# Patient Record
Sex: Female | Born: 1995 | Race: White | Hispanic: No | Marital: Single | Smoking: Never smoker
Health system: Southern US, Community
[De-identification: ages and names within clinical notes are randomized; demographics above are authoritative.]

## PROBLEM LIST (undated history)

## (undated) ENCOUNTER — Inpatient Hospital Stay (HOSPITAL_COMMUNITY): Payer: Self-pay

## (undated) DIAGNOSIS — Z6841 Body Mass Index (BMI) 40.0 and over, adult: Secondary | ICD-10-CM

## (undated) DIAGNOSIS — O139 Gestational [pregnancy-induced] hypertension without significant proteinuria, unspecified trimester: Secondary | ICD-10-CM

## (undated) DIAGNOSIS — N390 Urinary tract infection, site not specified: Secondary | ICD-10-CM

## (undated) HISTORY — DX: Gestational (pregnancy-induced) hypertension without significant proteinuria, unspecified trimester: O13.9

## (undated) HISTORY — PX: WISDOM TOOTH EXTRACTION: SHX21

## (undated) HISTORY — PX: TONSILLECTOMY: SUR1361

## (undated) HISTORY — PX: HERNIA REPAIR: SHX51

---
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
## 2017-04-16 DIAGNOSIS — N6459 Other signs and symptoms in breast: Secondary | ICD-10-CM | POA: Insufficient documentation

## 2017-12-29 LAB — OB RESULTS CONSOLE GC/CHLAMYDIA: GC PROBE AMP, GENITAL: NEGATIVE

## 2018-08-05 ENCOUNTER — Inpatient Hospital Stay (HOSPITAL_COMMUNITY)
Admission: AD | Admit: 2018-08-05 | Discharge: 2018-08-05 | Disposition: A | Discharge status: Home / Self Care | Payer: BC Managed Care – PPO | Source: Ambulatory Visit | Attending: Obstetrics and Gynecology | Admitting: Obstetrics and Gynecology

## 2018-08-05 ENCOUNTER — Other Ambulatory Visit: Payer: Self-pay

## 2018-08-05 ENCOUNTER — Encounter (HOSPITAL_COMMUNITY): Payer: Self-pay | Admitting: *Deleted

## 2018-08-05 ENCOUNTER — Inpatient Hospital Stay (HOSPITAL_BASED_OUTPATIENT_CLINIC_OR_DEPARTMENT_OTHER): Payer: BC Managed Care – PPO

## 2018-08-05 DIAGNOSIS — O9989 Other specified diseases and conditions complicating pregnancy, childbirth and the puerperium: Secondary | ICD-10-CM

## 2018-08-05 DIAGNOSIS — O99212 Obesity complicating pregnancy, second trimester: Secondary | ICD-10-CM

## 2018-08-05 DIAGNOSIS — E669 Obesity, unspecified: Secondary | ICD-10-CM | POA: Diagnosis not present

## 2018-08-05 DIAGNOSIS — N949 Unspecified condition associated with female genital organs and menstrual cycle: Secondary | ICD-10-CM | POA: Diagnosis not present

## 2018-08-05 DIAGNOSIS — O3680X0 Pregnancy with inconclusive fetal viability, not applicable or unspecified: Secondary | ICD-10-CM | POA: Diagnosis not present

## 2018-08-05 DIAGNOSIS — Z3A15 15 weeks gestation of pregnancy: Secondary | ICD-10-CM

## 2018-08-05 DIAGNOSIS — R102 Pelvic and perineal pain: Secondary | ICD-10-CM | POA: Diagnosis not present

## 2018-08-05 DIAGNOSIS — O26892 Other specified pregnancy related conditions, second trimester: Secondary | ICD-10-CM | POA: Insufficient documentation

## 2018-08-05 DIAGNOSIS — R1030 Lower abdominal pain, unspecified: Secondary | ICD-10-CM | POA: Diagnosis present

## 2018-08-05 LAB — CBC
HCT: 39.6 % (ref 36.0–46.0)
Hemoglobin: 13.6 g/dL (ref 12.0–15.0)
MCH: 30.3 pg (ref 26.0–34.0)
MCHC: 34.3 g/dL (ref 30.0–36.0)
MCV: 88.2 fL (ref 78.0–100.0)
PLATELETS: 251 10*3/uL (ref 150–400)
RBC: 4.49 MIL/uL (ref 3.87–5.11)
RDW: 12.6 % (ref 11.5–15.5)
WBC: 10.2 10*3/uL (ref 4.0–10.5)

## 2018-08-05 LAB — POCT PREGNANCY, URINE: Preg Test, Ur: POSITIVE — AB

## 2018-08-05 LAB — WET PREP, GENITAL
Clue Cells Wet Prep HPF POC: NONE SEEN
SPERM: NONE SEEN
Trich, Wet Prep: NONE SEEN
Yeast Wet Prep HPF POC: NONE SEEN

## 2018-08-05 LAB — URINALYSIS, ROUTINE W REFLEX MICROSCOPIC
Bilirubin Urine: NEGATIVE
GLUCOSE, UA: NEGATIVE mg/dL
HGB URINE DIPSTICK: NEGATIVE
Ketones, ur: 20 mg/dL — AB
NITRITE: NEGATIVE
PH: 6 (ref 5.0–8.0)
Protein, ur: NEGATIVE mg/dL
Specific Gravity, Urine: 1.023 (ref 1.005–1.030)

## 2018-08-05 NOTE — MAU Provider Note (Addendum)
History     CSN: [id number]  Arrival date and time: 08/05/18 1752   First Provider Initiated Contact with Patient 08/05/18 1911      Chief Complaint  Patient presents with  . Abdominal Pain  . Vaginal Discharge   G1 @15 .5 wks by sure LMP here with low abdominal cramping and vaginal discharge. Sx started about 1 week ago. LAP is bilateral and intermittent. Describes as mild menstrual cramping. Has not taken anything for it. Having some constipation, used Miralax and had small BM today. Vaginal discharge is white/yellow and thin, no odor or itching. No VB. No urinary sx. She reports [place] that was normal.   OB History    Gravida  1   Para      Term      Preterm      AB      Living        SAB      TAB      Ectopic      Multiple      Live Births              No past medical history on file.  Past Surgical History:  Procedure Laterality Date  . HERNIA REPAIR    . TONSILLECTOMY    . WISDOM TOOTH EXTRACTION      No family history on file.  Social History   Tobacco Use  . Smoking status: Never Smoker  Substance Use Topics  . Alcohol use: No  . Drug use: Not on file    Allergies: No Known Allergies  Medications Prior to Admission  Medication Sig Dispense Refill Last Dose  . TRI-PREVIFEM 0.18/0.215/0.25 MG-35 MCG tablet Take 1 tablet by mouth daily.  11 Past Week at Unknown time  . acetaminophen (TYLENOL) 325 MG tablet Take 325 mg by mouth every 6 (six) hours as needed for headache.   01/21/2015 at Unknown time  . HYDROcodone-acetaminophen (NORCO/VICODIN) 5-325 MG per tablet Take 1 tablet by mouth every 4 (four) hours as needed for pain. (Patient not taking: Reported on 01/21/2015) 10 tablet 0 Completed Course at Unknown time  . ibuprofen (ADVIL,MOTRIN) 800 MG tablet Take 1 tablet (800 mg total) by mouth 3 (three) times daily. (Patient not taking: Reported on 01/21/2015) 21 tablet 0 Completed Course at Unknown time    Review of  Systems  Constitutional: Negative for fever.  Gastrointestinal: Positive for abdominal pain and constipation. Negative for diarrhea, nausea and vomiting.  Genitourinary: Positive for vaginal discharge. Negative for dysuria, hematuria, urgency and vaginal bleeding.   Physical Exam   Blood pressure (!) 112/52, pulse (!) 128, temperature 98.4 F (36.9 C), temperature source Oral, resp. rate 14, height 5\' 9"  (1.753 m), weight (!) 176.7 kg, last menstrual period 04/17/2018, SpO2 95 %.  Physical Exam  Nursing note and vitals reviewed. Constitutional: She is oriented to person, place, and time. She appears well-developed and well-nourished. No distress.  HENT:  Head: Normocephalic and atraumatic.  Neck: Normal range of motion.  Respiratory: Effort normal. No respiratory distress.  GI: Soft. She exhibits no distension and no mass. There is no tenderness. There is no rebound and no guarding.  Large panus  Genitourinary:  Genitourinary Comments: External: no lesions or erythema Vagina: rugated, pink, moist, small thin white frothy discharge, malodorous Uterus/Adnexae: difficult to assess d/t body habitus Cervix closed/long   Musculoskeletal: Normal range of motion.  Neurological: She is alert and oriented to person, place, and  time.  Skin: Skin is warm and dry.  Psychiatric: She has a normal mood and affect.   Results for orders placed or performed during the hospital encounter of 08/05/18 (from the past 24 hour(s))  Urinalysis, Routine w reflex microscopic     Status: Abnormal   Collection Time: 08/05/18  6:49 PM  Result Value Ref Range   Color, Urine YELLOW YELLOW   APPearance HAZY (A) CLEAR   Specific Gravity, Urine 1.023 1.005 - 1.030   pH 6.0 5.0 - 8.0   Glucose, UA NEGATIVE NEGATIVE mg/dL   Hgb urine dipstick NEGATIVE NEGATIVE   Bilirubin Urine NEGATIVE NEGATIVE   Ketones, ur 20 (A) NEGATIVE mg/dL   Protein, ur NEGATIVE NEGATIVE mg/dL   Nitrite NEGATIVE NEGATIVE   Leukocytes,  UA SMALL (A) NEGATIVE   RBC / HPF 0-5 0 - 5 RBC/hpf   WBC, UA 6-10 0 - 5 WBC/hpf   Bacteria, UA FEW (A) NONE SEEN   Squamous Epithelial / LPF 6-10 0 - 5  Pregnancy, urine POC     Status: Abnormal   Collection Time: 08/05/18  6:54 PM  Result Value Ref Range   Preg Test, Ur POSITIVE (A) NEGATIVE  Wet prep, genital     Status: Abnormal   Collection Time: 08/05/18  7:23 PM  Result Value Ref Range   Yeast Wet Prep HPF POC NONE SEEN NONE SEEN   Trich, Wet Prep NONE SEEN NONE SEEN   Clue Cells Wet Prep HPF POC NONE SEEN NONE SEEN   WBC, Wet Prep HPF POC MANY (A) NONE SEEN   Sperm NONE SEEN   CBC     Status: None   Collection Time: 08/05/18  7:40 PM  Result Value Ref Range   WBC 10.2 4.0 - 10.5 K/uL   RBC 4.49 3.87 - 5.11 MIL/uL   Hemoglobin 13.6 12.0 - 15.0 g/dL   HCT 16.139.6 09.636.0 - 04.546.0 %   MCV 88.2 78.0 - 100.0 fL   MCH 30.3 26.0 - 34.0 pg   MCHC 34.3 30.0 - 36.0 g/dL   RDW 40.912.6 81.111.5 - 91.415.5 %   Platelets 251 150 - 400 K/uL    MAU Course  Procedures  MDM Labs and US ordered. Transfer of care given to Daylene Poseyeill, CNM Bhambri, Melanie, CNM  08/05/2018 8:02 PM   US shows IUP measuring 15w 5d with FHR 143 bpm, posterior placenta, normal fluid  Assessment and Plan   1. Round ligament pain   2. Pregnancy with inconclusive fetal viability   3. [redacted] weeks gestation of pregnancy   4. Pregnancy with inconclusive fetal viability, single or unspecified fetus   5. Obesity affecting pregnancy in second trimester    -Discharge home in stable condition -Encouraged patient to use pregnancy support belt and increase PO hydration -Preterm labor precautions discussed  -Patient advised to follow-up with OB of choice for prenatal care -Patient may return to MAU as needed or if her condition were to change or worsen  Rolm BookbinderCaroline M Meryem Haertel, [place] 08/05/18 9:37 PM

## 2018-08-05 NOTE — Discharge Instructions (Signed)
Round Ligament Pain °The round ligament is a cord of muscle and tissue that helps to support the uterus. It can become a source of pain during pregnancy if it becomes stretched or twisted as the baby grows. The pain usually begins in the second trimester of pregnancy, and it can come and go until the baby is delivered. It is not a serious problem, and it does not cause harm to the baby. °Round ligament pain is usually a short, sharp, and pinching pain, but it can also be a dull, lingering, and aching pain. The pain is felt in the lower side of the abdomen or in the groin. It usually starts deep in the groin and moves up to the outside of the hip area. Pain can occur with: °· A sudden change in position. °· Rolling over in bed. °· Coughing or sneezing. °· Physical activity. ° °Follow these instructions at home: °Watch your condition for any changes. Take these steps to help with your pain: °· When the pain starts, relax. Then try: °? Sitting down. °? Flexing your knees up to your abdomen. °? Lying on your side with one pillow under your abdomen and another pillow between your legs. °? Sitting in a warm bath for 15-20 minutes or until the pain goes away. °· Take over-the-counter and prescription medicines only as told by your health care provider. °· Move slowly when you sit and stand. °· Avoid long walks if they cause pain. °· Stop or lessen your physical activities if they cause pain. ° °Contact a health care provider if: °· Your pain does not go away with treatment. °· You feel pain in your back that you did not have before. °· Your medicine is not helping. °Get help right away if: °· You develop a fever or chills. °· You develop uterine contractions. °· You develop vaginal bleeding. °· You develop nausea or vomiting. °· You develop diarrhea. °· You have pain when you urinate. °This information is not intended to replace advice given to you by your health care provider. Make sure you discuss any questions you have  with your health care provider. °Document Released: 09/15/2008 Document Revised: 05/14/2016 Document Reviewed: 02/13/2015 °Elsevier Interactive Patient Education © 2018 Elsevier Inc. ° °Safe Medications in Pregnancy  ° °Acne: °Benzoyl Peroxide °Salicylic Acid ° °Backache/Headache: °Tylenol: 2 regular strength every 4 hours OR °             2 Extra strength every 6 hours ° °Colds/Coughs/Allergies: °Benadryl (alcohol free) 25 mg every 6 hours as needed °Breath right strips °Claritin °Cepacol throat lozenges °Chloraseptic throat spray °Cold-Eeze- up to three times per day °Cough drops, alcohol free °Flonase (by prescription only) °Guaifenesin °Mucinex °Robitussin DM (plain only, alcohol free) °Saline nasal spray/drops °Sudafed (pseudoephedrine) & Actifed ** use only after [redacted] weeks gestation and if you do not have high blood pressure °Tylenol °Vicks Vaporub °Zinc lozenges °Zyrtec  ° °Constipation: °Colace °Ducolax suppositories °Fleet enema °Glycerin suppositories °Metamucil °Milk of magnesia °Miralax °Senokot °Smooth move tea ° °Diarrhea: °Kaopectate °Imodium A-D ° °*NO pepto Bismol ° °Hemorrhoids: °Anusol °Anusol HC °Preparation H °Tucks ° °Indigestion: °Tums °Maalox °Mylanta °Zantac  °Pepcid ° °Insomnia: °Benadryl (alcohol free) 25mg every 6 hours as needed °Tylenol PM °Unisom, no Gelcaps ° °Leg Cramps: °Tums °MagGel ° °Nausea/Vomiting:  °Bonine °Dramamine °Emetrol °Ginger extract °Sea bands °Meclizine  °Nausea medication to take during pregnancy:  °Unisom (doxylamine succinate 25 mg tablets) Take one tablet daily at bedtime. If symptoms are not adequately controlled,   the dose can be increased to a maximum recommended dose of two tablets daily (1/2 tablet in the morning, 1/2 tablet mid-afternoon and one at bedtime). °Vitamin B6 100mg tablets. Take one tablet twice a day (up to 200 mg per day). ° °Skin Rashes: °Aveeno products °Benadryl cream or 25mg every 6 hours as needed °Calamine Lotion °1% cortisone  cream ° °Yeast infection: °Gyne-lotrimin 7 °Monistat 7 ° ° °**If taking multiple medications, please check labels to avoid duplicating the same active ingredients °**take medication as directed on the label °** Do not exceed 4000 mg of tylenol in 24 hours °**Do not take medications that contain aspirin or ibuprofen ° ° ° ° °

## 2018-08-05 NOTE — MAU Note (Signed)
Pt presents with c/o abdominal cramping @ umbilicus and vaginal discharge.  Denies VB or spotting.

## 2018-08-08 LAB — GC/CHLAMYDIA PROBE AMP (~~LOC~~) NOT AT ARMC
CHLAMYDIA, DNA PROBE: NEGATIVE
NEISSERIA GONORRHEA: NEGATIVE

## 2018-08-15 ENCOUNTER — Encounter: Payer: Self-pay | Admitting: Medical

## 2018-08-15 ENCOUNTER — Ambulatory Visit (INDEPENDENT_AMBULATORY_CARE_PROVIDER_SITE_OTHER): Payer: BC Managed Care – PPO | Admitting: Medical

## 2018-08-15 DIAGNOSIS — O0990 Supervision of high risk pregnancy, unspecified, unspecified trimester: Secondary | ICD-10-CM | POA: Insufficient documentation

## 2018-08-15 DIAGNOSIS — Z3490 Encounter for supervision of normal pregnancy, unspecified, unspecified trimester: Secondary | ICD-10-CM

## 2018-08-15 DIAGNOSIS — Z3402 Encounter for supervision of normal first pregnancy, second trimester: Secondary | ICD-10-CM | POA: Diagnosis not present

## 2018-08-15 NOTE — Progress Notes (Signed)
   PRENATAL VISIT NOTE  Subjective:  Kelly Lam is a 22 y.o. G1P0 at 10016w1d being seen today for first prenatal visit with us. She had [place] few visits with an OB in [place] prior to relocating here. She has some records scanned in Epic. She is currently monitored for the following issues for this high-risk pregnancy and has Encounter for supervision of normal pregnancy, unspecified, unspecified trimester and Morbid obesity (HCC) on their problem list.  Patient reports no complaints.  Contractions: Not present. Vag. Bleeding: None.   . Denies leaking of fluid.   The following portions of the patient's history were reviewed and updated as appropriate: allergies, current medications, past family history, past medical history, past social history, past surgical history and problem list. Problem list updated.  Objective:   Vitals:   08/15/18 0956  BP: 119/86  Pulse: (!) 109  Weight: (!) 392 lb 6.4 oz (178 kg)    Fetal Status:          Unable to obtain FHR with doppler due to maternal body habitus Bedside US performed - normal appears FHR noted. +FM noted   General:  Alert, oriented and cooperative. Patient is in no acute distress.  Skin: Skin is warm and dry. No rash noted.   Cardiovascular: Normal heart rate noted  Respiratory: Normal respiratory effort, no problems with respiration noted  Abdomen: Soft, gravid, appropriate for gestational age. No tenderness to palpation  Pain/Pressure: Present     Pelvic: Cervical exam deferred        Extremities: Normal range of motion.  Edema: None  Mental Status: Normal mood and affect. Normal behavior. Normal judgment and thought content.   Assessment and Plan:  Pregnancy: G1P0 at 7216w1d  1. Encounter for supervision of normal pregnancy, antepartum, unspecified gravidity - Discussed normal cadence of OB visits - Discussed reasons to present to MAU - Patient is taking PNV daily - [facility]  2. Morbid  obesity (HCC) - Patient advised to start 81 mg ASA daily - Discussed appropriate weight gain  - Discussed potential for regular growth scans throughout pregnancy   Preterm labor/ second trimester warning symptoms and general obstetric precautions including but not limited to vaginal bleeding, contractions, leaking of fluid and fetal movement were reviewed in detail with the patient. Please refer to After Visit Summary for other counseling recommendations.  Return in about 4 weeks (around 09/12/2018) for LOB.    Vonzella NippleJulie Wenzel, PA-C

## 2018-08-15 NOTE — Patient Instructions (Addendum)
Prenatal Care WHAT IS PRENATAL CARE? Prenatal care is the process of caring for a pregnant woman before she gives birth. Prenatal care makes sure that she and her baby remain as healthy as possible throughout pregnancy. Prenatal care may be provided by a midwife, family practice health care provider, or a childbirth and pregnancy specialist (obstetrician). Prenatal care may include physical examinations, testing, treatments, and education on nutrition, lifestyle, and social support services. WHY IS PRENATAL CARE SO IMPORTANT? Early and consistent prenatal care increases the chance that you and your baby will remain healthy throughout your pregnancy. This type of care also decreases a baby's risk of being born too early (prematurely), or being born smaller than expected (small for gestational age). Any underlying medical conditions you may have that could pose a risk during your pregnancy are discussed during prenatal care visits. You will also be monitored regularly for any new conditions that may arise during your pregnancy so they can be treated quickly and effectively. WHAT HAPPENS DURING PRENATAL CARE VISITS? Prenatal care visits may include the following: Discussion Tell your health care provider about any new signs or symptoms you have experienced since your last visit. These might include:  Nausea or vomiting.  Increased or decreased level of energy.  Difficulty sleeping.  Back or leg pain.  Weight changes.  Frequent urination.  Shortness of breath with physical activity.  Changes in your skin, such as the development of a rash or itchiness.  Vaginal discharge or bleeding.  Feelings of excitement or nervousness.  Changes in your baby's movements.  You may want to write down any questions or topics you want to discuss with your health care provider and bring them with you to your appointment. Examination During your first prenatal care visit, you will likely have a complete  physical exam. Your health care provider will often examine your vagina, cervix, and the position of your uterus, as well as check your heart, lungs, and other body systems. As your pregnancy progresses, your health care provider will measure the size of your uterus and your baby's position inside your uterus. He or she may also examine you for early signs of labor. Your prenatal visits may also include checking your blood pressure and, after about 10-12 weeks of pregnancy, listening to your baby's heartbeat. Testing Regular testing often includes:  Urinalysis. This checks your urine for glucose, protein, or signs of infection.  Blood count. This checks the levels of white and red blood cells in your body.  Tests for sexually transmitted infections (STIs). Testing for STIs at the beginning of pregnancy is routinely done and is required in many states.  Antibody testing. You will be checked to see if you are immune to certain illnesses, such as rubella, that can affect a developing fetus.  Glucose screen. Around 24-28 weeks of pregnancy, your blood glucose level will be checked for signs of gestational diabetes. Follow-up tests may be recommended.  Group B strep. This is a bacteria that is commonly found inside a woman's vagina. This test will inform your health care provider if you need an antibiotic to reduce the amount of this bacteria in your body prior to labor and childbirth.  Ultrasound. Many pregnant women undergo an ultrasound screening around 18-20 weeks of pregnancy to evaluate the health of the fetus and check for any developmental abnormalities.  HIV (human immunodeficiency virus) testing. Early in your pregnancy, you will be screened for HIV. If you are at high risk for HIV, this test may   be repeated during your third trimester of pregnancy.  You may be offered other testing based on your age, personal or family medical history, or other factors. HOW OFTEN SHOULD I PLAN TO SEE MY  HEALTH CARE PROVIDER FOR PRENATAL CARE? Your prenatal care check-up schedule depends on any medical conditions you have before, or develop during, your pregnancy. If you do not have any underlying medical conditions, you will likely be seen for checkups:  Monthly, during the first 6 months of pregnancy.  Twice a month during months 7 and 8 of pregnancy.  Weekly starting in the 9th month of pregnancy and until delivery.  If you develop signs of early labor or other concerning signs or symptoms, you may need to see your health care provider more often. Ask your health care provider what prenatal care schedule is best for you. WHAT CAN I DO TO KEEP MYSELF AND MY BABY AS HEALTHY AS POSSIBLE DURING MY PREGNANCY?  Take a prenatal vitamin containing 400 micrograms (0.4 mg) of folic acid every day. Your health care provider may also ask you to take additional vitamins such as iodine, vitamin D, iron, copper, and zinc.  Take 1500-2000 mg of calcium daily starting at your 20th week of pregnancy until you deliver your baby.  Make sure you are up to date on your vaccinations. Unless directed otherwise by your health care provider: ? You should receive a tetanus, diphtheria, and pertussis (Tdap) vaccination between the 27th and 36th week of your pregnancy, regardless of when your last Tdap immunization occurred. This helps protect your baby from whooping cough (pertussis) after he or she is born. ? You should receive an annual inactivated influenza vaccine (IIV) to help protect you and your baby from influenza. This can be done at any point during your pregnancy.  Eat a well-rounded diet that includes: ? Fresh fruits and vegetables. ? Lean proteins. ? Calcium-rich foods such as milk, yogurt, hard cheeses, and dark, leafy greens. ? Whole grain breads.  Do noteat seafood high in mercury, including: ? Swordfish. ? Tilefish. ? Shark. ? King mackerel. ? More than 6 oz tuna per week.  Do not  eat: ? Raw or undercooked meats or eggs. ? Unpasteurized foods, such as soft cheeses (brie, blue, or feta), juices, and milks. ? Lunch meats. ? Hot dogs that have not been heated until they are steaming.  Drink enough water to keep your urine clear or pale yellow. For many women, this may be 10 or more 8 oz glasses of water each day. Keeping yourself hydrated helps deliver nutrients to your baby and may prevent the start of pre-term uterine contractions.  Do not use any tobacco products including cigarettes, chewing tobacco, or electronic cigarettes. If you need help quitting, ask your health care provider.  Do not drink beverages containing alcohol. No safe level of alcohol consumption during pregnancy has been determined.  Do not use any illegal drugs. These can harm your developing baby or cause a miscarriage.  Ask your health care provider or pharmacist before taking any prescription or over-the-counter medicines, herbs, or supplements.  Limit your caffeine intake to no more than 200 mg per day.  Exercise. Unless told otherwise by your health care provider, try to get 30 minutes of moderate exercise most days of the week. Do not  do high-impact activities, contact sports, or activities with a high risk of falling, such as horseback riding or downhill skiing.  Get plenty of rest.  Avoid anything that raises your  body temperature, such as hot tubs and saunas.  If you own a cat, do not empty its litter box. Bacteria contained in cat feces can cause an infection called toxoplasmosis. This can result in serious harm to the fetus.  Stay away from chemicals such as insecticides, lead, mercury, and cleaning or paint products that contain solvents.  Do not have any X-rays taken unless medically necessary.  Take a childbirth and breastfeeding preparation class. Ask your health care provider if you need a referral or recommendation.  This information is not intended to replace advice given  to you by your health care provider. Make sure you discuss any questions you have with your health care provider. Document Released: 12/10/2003 Document Revised: 05/11/2016 Document Reviewed: 02/21/2014 Elsevier Interactive Patient Education  2017 [place] Education Options: [facility] Department Classes:  Childbirth education classes can help you get ready for a positive parenting experience. You can also meet other expectant parents and get free stuff for your baby. Each class runs for five weeks on the same night and costs $45 for the mother-to-be and her support person. Medicaid covers the cost if you are eligible. Call [phone] to register. [facility] Childbirth Education:  [phone] or [phone] or [email or web address]  Baby & Me Class: Discuss newborn & infant parenting and family adjustment issues with other new mothers in a relaxed environment. Each week brings a new speaker or baby-centered activity. We encourage new mothers to join [place] every Thursday at 11:00am. Babies birth until crawling. No registration or fee. Daddy [employer]: This course offers Dads-to-be the tools and knowledge needed to feel confident on their journey to becoming new fathers. Experienced dads, who have been trained as coaches, teach dads-to-be how to hold, comfort, diaper, swaddle and play with their infant while being able to support the new mom as well. A class for men taught by men. $25/dad Big Brother/Big Sister: Let your children share in the joy of a new brother or sister in this special class designed just for them. Class includes discussion about how families care for babies: swaddling, holding, diapering, safety as well as how they can be helpful in their new role. This class is designed for children ages 43 to 60, but any age is welcome. Please register each child individually. $5/child  Mom Talk: This mom-led group offers support and connection to mothers as  they journey through the adjustments and struggles of that sometimes overwhelming first year after the birth of a child. Tuesdays at 10:00am and Thursdays at 6:00pm. Babies welcome. No registration or fee. Breastfeeding Support Group: This group is a mother-to-mother support circle where moms have the opportunity to share their breastfeeding experiences. A Lactation Consultant is present for questions and concerns. Meets each Tuesday at 11:00am. No fee or registration. Breastfeeding Your Baby: Learn what to expect in the first days of breastfeeding your newborn.  This class will help you feel more confident with the skills needed to begin your breastfeeding experience. Many new mothers are concerned about breastfeeding after leaving the hospital. This class will also address the most common fears and challenges about breastfeeding during the first few weeks, months and beyond. (call for fee) Comfort Techniques and Tour: This 2 hour interactive class will provide you the opportunity to learn & practice hands-on techniques that can help relieve some of the discomfort of labor and encourage your baby to rotate toward the best position for birth. You and your partner will be able to try  a variety of labor positions with birth balls and rebozos as well as practice breathing, relaxation, and visualization techniques. A tour of the [facility] is included with this class. $20 per registrant and support person Childbirth Class- Weekend Option: This class is a Weekend version of our Birth & Baby series. It is designed for parents who have a difficult time fitting several weeks of classes into their schedule. It covers the care of your newborn and the basics of labor and childbirth. It also includes a [place] [facility] and lunch. The class is held two consecutive days: beginning on Friday evening from 6:30 - 8:30 p.m. and the next day, Saturday from 9 a.m. - 4 p.m.  (call for fee) Doren Custard Class: Interested in a waterbirth?  This informational class will help you discover whether waterbirth is the right fit for you. Education about waterbirth itself, supplies you would need and how to assemble your support team is what you can expect from this class. Some obstetrical practices require this class in order to pursue a waterbirth. (Not all obstetrical practices offer waterbirth-check with your healthcare provider.) Register only the expectant mom, but you are encouraged to bring your partner to class! Required if planning waterbirth, no fee. Infant/Child CPR: Parents, grandparents, babysitters, and friends learn Cardio-Pulmonary Resuscitation skills for infants and children. You will also learn how to treat both conscious and unconscious choking in infants and children. This Family & Friends program does not offer certification. Register each participant individually to ensure that enough mannequins are available. (Call for fee) Grandparent Love: Expecting a grandbaby? This class is for you! Learn about the latest infant care and safety recommendations and ways to support your own child as he or she transitions into the parenting role. Taught by Registered Nurses who are childbirth instructors, but most importantly...they are grandmothers too! $10/person. Childbirth Class- Natural Childbirth: This series of 5 weekly classes is for expectant parents who want to learn and practice natural methods of coping with the process of labor and childbirth. Relaxation, breathing, massage, visualization, role of the partner, and helpful positioning are highlighted. Participants learn how to be confident in their body's ability to give birth. This class will empower and help parents make informed decisions about their own care. Includes discussion that will help new parents transition into the immediate postpartum period. [facility] is included. We  suggest taking this class between 25-32 weeks, but it's only a recommendation. $75 per registrant and one support person or $30 Medicaid. Childbirth Class- 3 week Series: This option of 3 weekly classes helps you and your labor partner prepare for childbirth. Newborn care, labor & birth, cesarean birth, pain management, and comfort techniques are discussed and a [place] [facility] is included. The class meets at the same time, on the same day of the week for 3 consecutive weeks beginning with the starting date you choose. $60 for registrant and one support person.  Marvelous Multiples: Expecting twins, triplets, or more? This class covers the differences in labor, birth, parenting, and breastfeeding issues that face multiples' parents. NICU tour is included. Led by a Certified Childbirth Educator who is the mother of twins. No fee. Caring for Baby: This class is for expectant and adoptive parents who want to learn and practice the most up-to-date newborn care for their babies. Focus is on birth through the first six weeks of life. Topics include feeding, bathing, diapering,  crying, umbilical cord care, circumcision care and safe sleep. Parents learn to recognize symptoms of illness and when to call the pediatrician. Register only the mom-to-be and your partner or support person can plan to come with you! $10 per registrant and support person Childbirth Class- online option: This online class offers you the freedom to complete a Birth and Baby series in the comfort of your own home. The flexibility of this option allows you to review sections at your own pace, at times convenient to you and your support people. It includes additional video information, animations, quizzes, and extended activities. Get organized with helpful eClass tools, checklists, and trackers. Once you register online for the class, you will receive an email within a few days to accept the invitation and begin the  class when the time is right for you. The content will be available to you for 60 days. $60 for 60 days of online access for you and your support people.  Local Doulas: Natural Baby Doulas [email or web address] Tel: [phone] [email or web address] [employer] [phone] [email or web address] The Labor Hassell Halim  (also do waterbirth tub rental) [phone] [email or web address] Triad Birth Doula [phone] [email or web address] NotebookDistributors.fi Sacred Rhythms  [phone] https://[email or web address]-[email or web address]/ [employer] (PADA) [email or web address]/ [place] Birth and Baby  [email or web address]/ Considering Waterbirth? Guide for patients at [facility] [employer]  Why consider waterbirth?  . Gentle birth for babies . Less pain medicine used in labor . May allow for passive descent/less pushing . May reduce perineal tears  . More mobility and instinctive maternal position changes . Increased maternal relaxation . Reduced blood pressure in labor  Is waterbirth safe? What are the risks of infection, drowning or other complications?  . Infection: o Very low risk (3.7 % for tub vs 4.8% for bed) o 7 in 8000 waterbirths with documented infection o Poorly cleaned equipment most common cause o Slightly lower group B strep transmission rate  . Drowning o Maternal:  - Very low risk   - Related to seizures or fainting o Newborn:  - Very low risk. No evidence of increased risk of respiratory problems in multiple large studies - Physiological protection from breathing under water - Avoid underwater birth if there are any fetal complications - Once baby's head is out of the water, keep it out.  . Birth complication o Some reports of cord trauma, but risk decreased by bringing baby to surface  gradually o No evidence of increased risk of shoulder dystocia. Mothers can usually change positions faster in water than in a bed, possibly aiding the maneuvers to free the shoulder.   You must attend a Doren Custard class at [facility]  3rd Wednesday of every month from 7-9pm  [employer] by calling [phone] or online at [email or web address]  Bring [place] the certificate from the class to your prenatal appointment  Meet with a midwife at 36 weeks to see if you can still plan a waterbirth and to sign the consent.   Purchase or rent the following supplies:   Water Birth Pool (Birth Pool in a Box or [place] for instance)  (Tubs start ~$125)  Single-use disposable tub liner designed for your brand of tub  New garden hose labeled "lead-free", "suitable for drinking water",  Electric drain pump to remove water (We recommend 792 gallon per hour or greater pump.)   Separate garden hose to remove the dirty water  Fish net  Bathing suit top (optional)  Long-handled mirror (optional)  Places to purchase or rent supplies  GotWebTools.is for tub purchases and supplies  Waterbirthsolutions.com for tub purchases and supplies  The Labor Ladies (www.[email or web address]) $275 for tub rental/set-up & take down/kit   [employer] (http://www.fleming.com/.htm) Information regarding doulas (labor support) who provide pool rentals  Our practice has a Birth Pool in a Box tub at the hospital that you may borrow on a first-come-first-served basis. It is your responsibility to to set up, clean and break down the tub. We cannot guarantee the availability of this tub in advance. You are responsible for bringing all accessories listed above. If you do not have all necessary supplies you cannot have a waterbirth.    Things that would prevent you from having a waterbirth:  Premature, <37wks  Previous cesarean birth  Presence of thick meconium-stained  fluid  Multiple gestation (Twins, triplets, etc.)  Uncontrolled diabetes or gestational diabetes requiring medication  Hypertension requiring medication or diagnosis of pre-eclampsia  Heavy vaginal bleeding  Non-reassuring fetal heart rate  Active infection (MRSA, etc.). Group B Strep is NOT a contraindication for  waterbirth.  If your labor has to be induced and induction method requires continuous  monitoring of the baby's heart rate  Other risks/issues identified by your obstetrical provider  Please remember that birth is unpredictable. Under certain unforeseeable circumstances your provider may advise against giving birth in the tub. These decisions will be made on a case-by-case basis and with the safety of you and your baby as our highest priority.     AREA PEDIATRIC/FAMILY [place] [address] [place], [place]  [address] Phone - [phone]   Fax - [phone]  [facility] [place] [address] [place], [place] [address] Phone - [phone]   Fax - [place] [address], [place]  [address] Phone - [phone]   Fax - [phone]  [place] [place] [address] [place], [place]  [address] Phone - [phone]   Fax - [phone]  [place] [address] [place], [place]  [address] Phone - [phone]   Fax - [phone]  [facility] [address] [place], [place]  [address] Phone - [phone]   Fax - [place] [address], [place], [place]  [address] Phone - [phone]   Fax - [phone]  [place] [address] [place], [place] [address] [place], [place]  [address] Phone - [phone]   Fax - [place] [address] [place], [place]  [address] Phone - [phone]    Fax - [phone] [facility] [place]. [address] [place], [place]  [address] Phone - [phone]   Fax - [phone]  [place] [address] [place], [place]  [address] Phone - [phone]   Fax - [phone]  [facility] [place], [place], [place]  [address] Phone - [phone]   Fax - [place] [address] [place], [place]  [address] Phone - [phone]   Fax - [phone]  [facility] [address], [place], [place]  [address] Phone - [place], [place]  [address] Phone - [phone]   Fax - [place] [address], [place], [place]  [address] Phone -  [phone]   Fax - [phone]  [place] [address] [place], [place]  [address] Phone - [phone]   Fax - [place], [place]  [address] Phone - [phone]   Fax - [place], [place], [place]  [address] Phone - [phone]   Fax - [place] [address], [place], [place]  [address] Phone - [phone]   Fax - [phone]  [place] [address], [place], [place]  [address] Phone - [phone]   Fax - [place] [address], [place], [place]  [address] Phone - [phone]   Fax - [phone]  [place] [address] [place], [place]  [address] Phone - [phone]   Fax - [phone] [place] [address] [place], [place]  [address] Phone - [phone]   Fax - [place] [address] [place], [place]  [address] Phone - [phone]   Fax -  [place] [address], [place], [place]  [address] Phone - [phone]   Fax - [phone]  [place] [address] [place] [address] Phone [phone]  Fax [phone]  Safe Medications in Pregnancy   Acne:  Benzoyl Peroxide  Salicylic Acid   Backache/Headache:  Tylenol: 2 regular strength every 4 hours OR        2 Extra strength every 6 hours   Colds/Coughs/Allergies:  Benadryl (alcohol free) 25 mg every 6 hours as needed  Breath right strips  Claritin  Cepacol throat lozenges  Chloraseptic throat spray  Cold-Eeze- up to three times per day  Cough drops, alcohol free  Flonase (by prescription only)  Guaifenesin  Mucinex  Robitussin DM (plain only, alcohol free)  Saline nasal spray/drops  Sudafed (pseudoephedrine) & Actifed * use only after [redacted] weeks gestation and if you do not have high blood pressure  Tylenol  Vicks Vaporub  Zinc lozenges  Zyrtec   Constipation:  Colace  Ducolax suppositories  Fleet enema  Glycerin suppositories  Metamucil  Milk of magnesia  Miralax  Senokot  Smooth move tea   Diarrhea:  Kaopectate  Imodium A-D   *NO pepto Bismol   Hemorrhoids:  Anusol  Anusol HC  Preparation H  Tucks   Indigestion:  Tums  Maalox  Mylanta  Zantac  Pepcid   Insomnia:  Benadryl (alcohol free) 5m every 6 hours as needed  Tylenol PM  Unisom, no Gelcaps   Leg Cramps:  Tums  MagGel   Nausea/Vomiting:  Bonine  Dramamine  Emetrol  Ginger extract  Sea bands  Meclizine  Nausea medication to take during pregnancy:  Unisom (doxylamine succinate 25 mg tablets) Take one tablet daily at bedtime. If symptoms are not adequately controlled, the dose can be increased to a maximum recommended dose of two tablets daily (1/2 tablet in the morning, 1/2 tablet mid-afternoon and one at bedtime).  Vitamin B6 1085mtablets.  Take one tablet twice a day (up to 200 mg per day).   Skin Rashes:  Aveeno products  Benadryl cream or 2543mvery 6 hours as needed  Calamine Lotion  1% cortisone cream   Yeast infection:  Gyne-lotrimin 7  Monistat 7    **If taking multiple medications, please check labels to avoid  duplicating the same active ingredients  **take medication as directed on the label  ** Do not exceed 4000 mg of tylenol in 24 hours  **Do not take medications that contain aspirin or ibuprofen         Genetic Screening Results Information: You are having genetic testing called [facility] today.  It will take approximately 2 weeks before the results are available.  To get your results, you need Internet access to a web browser to search [place]/MyChart (the direct app on your phone will not give you these results).  Then select Lab Scanned and click on the blue hyper link that says View Image to see your [facility] results.  You can also use the directions on the purple card given to look up your results directly on the [place] website.

## 2018-08-24 ENCOUNTER — Encounter (HOSPITAL_COMMUNITY): Payer: Self-pay

## 2018-08-24 ENCOUNTER — Telehealth: Payer: Self-pay

## 2018-08-24 NOTE — Telephone Encounter (Signed)
Attempted to return call about results, no answer, busy signal, unable to leave voicemail.

## 2018-08-24 NOTE — Telephone Encounter (Signed)
Returned call and, as patient's request, I left detailed message stating that panorama was not drawn.

## 2018-08-30 LAB — HEMOGLOBINOPATHY EVALUATION
FERRITIN: 46 ng/mL (ref 15–77)
HEMATOCRIT: 38.5 % (ref 34.0–46.6)
HEMOGLOBIN: 13.3 g/dL (ref 11.1–15.9)
HGB C: 0 %
HGB F QUANT: 0 % (ref 0.0–2.0)
HGB VARIANT: 0 %
Hgb A2 Quant: 2.4 % (ref 1.8–3.2)
Hgb A: 97.6 % (ref 96.4–98.8)
Hgb S: 0 %
Hgb Solubility: NEGATIVE
MCH: 30.4 pg (ref 26.6–33.0)
MCHC: 34.5 g/dL (ref 31.5–35.7)
MCV: 88 fL (ref 79–97)
Platelets: 237 10*3/uL (ref 150–450)
RBC: 4.37 x10E6/uL (ref 3.77–5.28)
RDW: 13.4 % (ref 12.3–15.4)
WBC: 7.7 10*3/uL (ref 3.4–10.8)

## 2018-08-30 LAB — HIV ANTIBODY (ROUTINE TESTING W REFLEX): HIV Screen 4th Generation wRfx: NONREACTIVE

## 2018-08-30 LAB — SMN1 COPY NUMBER ANALYSIS (SMA CARRIER SCREENING)

## 2018-08-30 LAB — CYSTIC FIBROSIS MUTATION 97: Interpretation: NOT DETECTED

## 2018-08-30 LAB — RPR: RPR Ser Ql: NONREACTIVE

## 2018-08-31 ENCOUNTER — Ambulatory Visit (HOSPITAL_COMMUNITY)
Admission: RE | Admit: 2018-08-31 | Discharge: 2018-08-31 | Disposition: A | Discharge status: Home / Self Care | Payer: BC Managed Care – PPO | Source: Ambulatory Visit | Attending: Medical | Admitting: Medical

## 2018-08-31 ENCOUNTER — Encounter (HOSPITAL_COMMUNITY): Payer: Self-pay

## 2018-08-31 DIAGNOSIS — Z362 Encounter for other antenatal screening follow-up: Secondary | ICD-10-CM

## 2018-08-31 DIAGNOSIS — Z3A19 19 weeks gestation of pregnancy: Secondary | ICD-10-CM | POA: Diagnosis not present

## 2018-08-31 DIAGNOSIS — O99212 Obesity complicating pregnancy, second trimester: Secondary | ICD-10-CM

## 2018-08-31 DIAGNOSIS — Z363 Encounter for antenatal screening for malformations: Secondary | ICD-10-CM | POA: Diagnosis not present

## 2018-08-31 DIAGNOSIS — Z3490 Encounter for supervision of normal pregnancy, unspecified, unspecified trimester: Secondary | ICD-10-CM

## 2018-08-31 HISTORY — DX: Morbid (severe) obesity due to excess calories: E66.01

## 2018-08-31 HISTORY — DX: Urinary tract infection, site not specified: N39.0

## 2018-08-31 HISTORY — DX: Body Mass Index (BMI) 40.0 and over, adult: Z684

## 2018-09-01 ENCOUNTER — Other Ambulatory Visit (HOSPITAL_COMMUNITY): Payer: Self-pay | Admitting: *Deleted

## 2018-09-01 DIAGNOSIS — O99212 Obesity complicating pregnancy, second trimester: Secondary | ICD-10-CM

## 2018-09-12 ENCOUNTER — Ambulatory Visit (INDEPENDENT_AMBULATORY_CARE_PROVIDER_SITE_OTHER): Payer: BC Managed Care – PPO | Admitting: Obstetrics & Gynecology

## 2018-09-12 DIAGNOSIS — Z3400 Encounter for supervision of normal first pregnancy, unspecified trimester: Secondary | ICD-10-CM

## 2018-09-12 NOTE — Progress Notes (Signed)
   PRENATAL VISIT NOTE  Subjective:  Kelly Lam is a 22 y.o. G1P0 at 9915w1d being seen today for ongoing prenatal care.  She is currently monitored for the following issues for this high-risk pregnancy and has Encounter for supervision of normal pregnancy, unspecified, unspecified trimester and Morbid obesity (HCC) on their problem list.  Patient reports no complaints.  Contractions: Not present. Vag. Bleeding: None.  Movement: Present. Denies leaking of fluid.   The following portions of the patient's history were reviewed and updated as appropriate: allergies, current medications, past family history, past medical history, past social history, past surgical history and problem list. Problem list updated.  Objective:   Vitals:   09/12/18 1437  BP: 134/82  Weight: (!) 397 lb 6.4 oz (180.3 kg)    Fetal Status: Fetal Heart Rate (bpm): 142   Movement: Present     General:  Alert, oriented and cooperative. Patient is in no acute distress.  Skin: Skin is warm and dry. No rash noted.   Cardiovascular: Normal heart rate noted  Respiratory: Normal respiratory effort, no problems with respiration noted  Abdomen: Soft, gravid, appropriate for gestational age.  Pain/Pressure: Absent     Pelvic: Cervical exam deferred        Extremities: Normal range of motion.  Edema: None  Mental Status: Normal mood and affect. Normal behavior. Normal judgment and thought content.   Assessment and Plan:  Pregnancy: G1P0 at 3315w1d  1. 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 Please refer to After Visit Summary for other counseling recommendations.  Return in about 4 weeks (around 10/10/2018).  Future Appointments  Date Time  Provider Department Center  09/28/2018  3:15 PM [facility] US 4 [facility]  10/10/2018  2:30 PM Constant, Gigi GinPeggy, MD [facility] None    Scheryl DarterJames Davona Kinoshita, MD

## 2018-09-12 NOTE — Progress Notes (Signed)
Pt is here for ROB. G1P0 7323w1d.

## 2018-09-12 NOTE — Patient Instructions (Signed)
Second Trimester of Pregnancy The second trimester is from week 13 through week 28, month 4 through 6. This is often the time in pregnancy that you feel your best. Often times, morning sickness has lessened or quit. You may have more energy, and you may get hungry more often. Your unborn baby (fetus) is growing rapidly. At the end of the sixth month, he or she is about 9 inches long and weighs about 1 pounds. You will likely feel the baby move (quickening) between 18 and 20 weeks of pregnancy. Follow these instructions at home:  Avoid all smoking, herbs, and alcohol. Avoid drugs not approved by your doctor.  Do not use any tobacco products, including cigarettes, chewing tobacco, and electronic cigarettes. If you need help quitting, ask your doctor. You may get counseling or other support to help you quit.  Only take medicine as told by your doctor. Some medicines are safe and some are not during pregnancy.  Exercise only as told by your doctor. Stop exercising if you start having cramps.  Eat regular, healthy meals.  Wear a good support bra if your breasts are tender.  Do not use hot tubs, steam rooms, or saunas.  Wear your seat belt when driving.  Avoid raw meat, uncooked cheese, and liter boxes and soil used by cats.  Take your prenatal vitamins.  Take 1500-2000 milligrams of calcium daily starting at the 20th week of pregnancy until you deliver your baby.  Try taking medicine that helps you poop (stool softener) as needed, and if your doctor approves. Eat more fiber by eating fresh fruit, vegetables, and whole grains. Drink enough fluids to keep your pee (urine) clear or pale yellow.  Take warm water baths (sitz baths) to soothe pain or discomfort caused by hemorrhoids. Use hemorrhoid cream if your doctor approves.  If you have puffy, bulging veins (varicose veins), wear support hose. Raise (elevate) your feet for 15 minutes, 3-4 times a day. Limit salt in your diet.  Avoid heavy  lifting, wear low heals, and sit up straight.  Rest with your legs raised if you have leg cramps or low back pain.  Visit your dentist if you have not gone during your pregnancy. Use a soft toothbrush to brush your teeth. Be gentle when you floss.  You can have sex (intercourse) unless your doctor tells you not to.  Go to your doctor visits. Get help if:  You feel dizzy.  You have mild cramps or pressure in your lower belly (abdomen).  You have a nagging pain in your belly area.  You continue to feel sick to your stomach (nauseous), throw up (vomit), or have watery poop (diarrhea).  You have bad smelling fluid coming from your vagina.  You have pain with peeing (urination). Get help right away if:  You have a fever.  You are leaking fluid from your vagina.  You have spotting or bleeding from your vagina.  You have severe belly cramping or pain.  You lose or gain weight rapidly.  You have trouble catching your breath and have chest pain.  You notice sudden or extreme puffiness (swelling) of your face, hands, ankles, feet, or legs.  You have not felt the baby move in over an hour.  You have severe headaches that do not go away with medicine.  You have vision changes. This information is not intended to replace advice given to you by your health care provider. Make sure you discuss any questions you have with your health care   provider. Document Released: 03/03/2010 Document Revised: 05/14/2016 Document Reviewed: 02/07/2013 Elsevier Interactive Patient Education  2017 Elsevier Inc.  

## 2018-09-28 ENCOUNTER — Ambulatory Visit (HOSPITAL_COMMUNITY): Payer: Self-pay

## 2018-09-28 ENCOUNTER — Ambulatory Visit (HOSPITAL_COMMUNITY)
Admission: RE | Admit: 2018-09-28 | Discharge: 2018-09-28 | Disposition: A | Discharge status: Home / Self Care | Payer: BC Managed Care – PPO | Source: Ambulatory Visit | Attending: Obstetrics & Gynecology | Admitting: Obstetrics & Gynecology

## 2018-09-28 ENCOUNTER — Encounter (HOSPITAL_COMMUNITY): Payer: Self-pay

## 2018-09-28 ENCOUNTER — Other Ambulatory Visit (HOSPITAL_COMMUNITY): Payer: Self-pay | Admitting: *Deleted

## 2018-09-28 ENCOUNTER — Ambulatory Visit (HOSPITAL_COMMUNITY): Payer: BC Managed Care – PPO

## 2018-09-28 ENCOUNTER — Ambulatory Visit (HOSPITAL_COMMUNITY)
Admission: RE | Admit: 2018-09-28 | Discharge: 2018-09-28 | Disposition: A | Discharge status: Home / Self Care | Payer: BC Managed Care – PPO | Source: Ambulatory Visit | Attending: Medical | Admitting: Medical

## 2018-09-28 DIAGNOSIS — Z362 Encounter for other antenatal screening follow-up: Secondary | ICD-10-CM

## 2018-09-28 DIAGNOSIS — O99212 Obesity complicating pregnancy, second trimester: Secondary | ICD-10-CM | POA: Diagnosis present

## 2018-09-28 DIAGNOSIS — Z3400 Encounter for supervision of normal first pregnancy, unspecified trimester: Secondary | ICD-10-CM

## 2018-09-28 DIAGNOSIS — Z3A23 23 weeks gestation of pregnancy: Secondary | ICD-10-CM | POA: Diagnosis not present

## 2018-10-04 ENCOUNTER — Encounter: Payer: Self-pay | Admitting: *Deleted

## 2018-10-10 ENCOUNTER — Encounter: Payer: Self-pay | Admitting: Obstetrics and Gynecology

## 2018-10-10 ENCOUNTER — Ambulatory Visit (INDEPENDENT_AMBULATORY_CARE_PROVIDER_SITE_OTHER): Payer: BC Managed Care – PPO | Admitting: Obstetrics and Gynecology

## 2018-10-10 VITALS — BP 144/82 | HR 112 | Wt 399.0 lb

## 2018-10-10 DIAGNOSIS — Z3402 Encounter for supervision of normal first pregnancy, second trimester: Secondary | ICD-10-CM

## 2018-10-10 DIAGNOSIS — Z23 Encounter for immunization: Secondary | ICD-10-CM | POA: Diagnosis not present

## 2018-10-10 DIAGNOSIS — Z3400 Encounter for supervision of normal first pregnancy, unspecified trimester: Secondary | ICD-10-CM

## 2018-10-10 DIAGNOSIS — O99212 Obesity complicating pregnancy, second trimester: Secondary | ICD-10-CM

## 2018-10-10 NOTE — Progress Notes (Signed)
   PRENATAL VISIT NOTE  Subjective:  Kelly Lam is a 22 y.o. G1P0 at [redacted]w[redacted]d being seen today for ongoing prenatal care.  She is currently monitored for the following issues for this high-risk pregnancy and has Encounter for supervision of normal pregnancy, unspecified, unspecified trimester and Morbid obesity (HCC) on their problem list.  Patient reports no complaints.  Contractions: Not present. Vag. Bleeding: None.  Movement: Present. Denies leaking of fluid.   The following portions of the patient's history were reviewed and updated as appropriate: allergies, current medications, past family history, past medical history, past social history, past surgical history and problem list. Problem list updated.  Objective:   Vitals:   10/10/18 1424  Weight: (!) 399 lb (181 kg)    Fetal Status: Fetal Heart Rate (bpm): 141   Movement: Present     General:  Alert, oriented and cooperative. Patient is in no acute distress.  Skin: Skin is warm and dry. No rash noted.   Cardiovascular: Normal heart rate noted  Respiratory: Normal respiratory effort, no problems with respiration noted  Abdomen: Soft, gravid, appropriate for gestational age.  Pain/Pressure: Present     Pelvic: Cervical exam deferred        Extremities: Normal range of motion.  Edema: None  Mental Status: Normal mood and affect. Normal behavior. Normal judgment and thought content.   Assessment and Plan:  Pregnancy: G1P0 at [redacted]w[redacted]d  1. Supervision of normal first pregnancy, antepartum Patient is doing well without complaints Third trimester labs next visit Patient declined genetic screening test Follow up ultrasound on 11/6 Flu vaccine today Patient with elevated BP today- will monitor closely  2. Morbid obesity (HCC) Patient with 15lb weight gain thus far Reviewed diet with the patient  Preterm labor symptoms and general obstetric precautions including but not limited to vaginal bleeding, contractions, leaking of fluid  and fetal movement were reviewed in detail with the patient. Please refer to After Visit Summary for other counseling recommendations.  No follow-ups on file.  Future Appointments  Date Time Provider Department Center  10/26/2018  4:00 PM [facility] [place] 3 [facility]    Catalina Antigua, MD

## 2018-10-10 NOTE — Patient Instructions (Signed)

## 2018-10-18 ENCOUNTER — Encounter (HOSPITAL_COMMUNITY): Payer: Self-pay | Admitting: *Deleted

## 2018-10-18 ENCOUNTER — Inpatient Hospital Stay (HOSPITAL_COMMUNITY)
Admission: AD | Admit: 2018-10-18 | Discharge: 2018-10-18 | Disposition: A | Discharge status: Home / Self Care | Payer: BC Managed Care – PPO | Source: Ambulatory Visit | Attending: Family Medicine | Admitting: Family Medicine

## 2018-10-18 DIAGNOSIS — Z3A26 26 weeks gestation of pregnancy: Secondary | ICD-10-CM | POA: Insufficient documentation

## 2018-10-18 DIAGNOSIS — O368330 Maternal care for abnormalities of the fetal heart rate or rhythm, third trimester, not applicable or unspecified: Secondary | ICD-10-CM | POA: Diagnosis not present

## 2018-10-18 DIAGNOSIS — Z9889 Other specified postprocedural states: Secondary | ICD-10-CM | POA: Diagnosis not present

## 2018-10-18 DIAGNOSIS — Z3689 Encounter for other specified antenatal screening: Secondary | ICD-10-CM

## 2018-10-18 DIAGNOSIS — O99212 Obesity complicating pregnancy, second trimester: Secondary | ICD-10-CM | POA: Insufficient documentation

## 2018-10-18 DIAGNOSIS — O368120 Decreased fetal movements, second trimester, not applicable or unspecified: Secondary | ICD-10-CM | POA: Diagnosis present

## 2018-10-18 DIAGNOSIS — Z833 Family history of diabetes mellitus: Secondary | ICD-10-CM | POA: Diagnosis not present

## 2018-10-18 DIAGNOSIS — O368190 Decreased fetal movements, unspecified trimester, not applicable or unspecified: Secondary | ICD-10-CM | POA: Diagnosis not present

## 2018-10-18 NOTE — MAU Note (Signed)
Pt stated baby is usually active. Not felt baby move much since yesterday. Denies any pain or cramping, bleeding or discharge.

## 2018-10-18 NOTE — MAU Provider Note (Signed)
History     CSN: [id number]  Arrival date and time: 10/18/18 1747   First Provider Initiated Contact with Patient 10/18/18 1902      Chief Complaint  Patient presents with  . Decreased Fetal Movement   HPI Kelly Lam is a 22 y.o. G1P0 at [redacted]w[redacted]d who presents with decreased fetal movement. She states she ran into a door knob this morning and didn't feel as much movement after that. She is feeling movement since arrival to MAU. She denies leaking, bleeding or discharge. Denies any pain.   OB History    Gravida  1   Para      Term      Preterm      AB      Living        SAB      TAB      Ectopic      Multiple      Live Births              Past Medical History:  Diagnosis Date  . Morbid obesity with BMI of 50.0-59.9, adult (HCC)   . Urinary tract infection     Past Surgical History:  Procedure Laterality Date  . HERNIA REPAIR    . TONSILLECTOMY    . WISDOM TOOTH EXTRACTION      Family History  Problem Relation Age of Onset  . Hypertension Father   . Diabetes Maternal Grandfather   . Congestive Heart Failure Maternal Grandfather   . Cancer Maternal Grandfather   . Huntington's disease Paternal Grandfather     Social History   Tobacco Use  . Smoking status: Never Smoker  . Smokeless tobacco: Never Used  Substance Use Topics  . Alcohol use: No  . Drug use: Never    Allergies: No Known Allergies  Medications Prior to Admission  Medication Sig Dispense Refill Last Dose  . acetaminophen (TYLENOL) 325 MG tablet Take 325 mg by mouth every 6 (six) hours as needed for headache.   10/17/2018 at Unknown time  . aspirin 81 MG chewable tablet Chew by mouth daily.   10/17/2018 at Unknown time  . Calcium Carbonate-Vit D-Min (CALCIUM 1200 PO) Take by mouth.   10/17/2018 at Unknown time  . Prenatal Vit-Fe Fumarate-FA (MULTIVITAMIN-PRENATAL) 27-0.8 MG TABS tablet Take 1 tablet by mouth daily at 12 noon.   10/17/2018 at Unknown time    Review of  Systems  Eyes: Negative for visual disturbance.  Gastrointestinal: Negative for abdominal pain.  Genitourinary: Negative for dysuria, vaginal bleeding and vaginal discharge.  Neurological: Negative for headaches.   Physical Exam   Blood pressure 131/73, pulse (!) 126, temperature 98 F (36.7 C), resp. rate 18, height 5\' 9"  (1.753 m), weight (!) 182.3 kg, last menstrual period 04/17/2018, SpO2 96 %.  Physical Exam  Nursing note and vitals reviewed. Constitutional: She is oriented to person, place, and time. She appears well-developed and well-nourished. No distress.  HENT:  Head: Normocephalic.  Eyes: Pupils are equal, round, and reactive to light.  Cardiovascular: Normal rate, regular rhythm and normal heart sounds.  Respiratory: Effort normal and breath sounds normal. No respiratory distress.  GI: Soft. Bowel sounds are normal. She exhibits no distension. There is no tenderness.  Neurological: She is alert and oriented to person, place, and time.  Skin: Skin is warm and dry.  Psychiatric: She has a normal mood and affect. Her behavior is normal. Judgment and thought content normal.   Fetal Tracing:  Baseline: 135  Variability: moderate Accels: 10x10 Decels: none  Toco: none  MAU Course  Procedures  MDM NST- reassuring for gestational age Patient feeling movement while in MAU  Assessment and Plan   1. Decreased fetal movement affecting management of pregnancy, antepartum, single or unspecified fetus   2. NST (non-stress test) reactive   3. [redacted] weeks gestation of pregnancy    -Discharge home in stable condition -Fetal movement precautions discussed. Discussed abdominal trauma in pregnancy -Patient advised to follow-up with [facility] as scheduled for prenatal care -Patient may return to MAU as needed or if her condition were to change or worsen  Rolm Bookbinder CNM 10/18/2018, 7:02 PM

## 2018-10-18 NOTE — Discharge Instructions (Signed)

## 2018-10-26 ENCOUNTER — Ambulatory Visit (HOSPITAL_COMMUNITY)
Admission: RE | Admit: 2018-10-26 | Discharge: 2018-10-26 | Disposition: A | Discharge status: Home / Self Care | Payer: BC Managed Care – PPO | Source: Ambulatory Visit | Attending: Medical | Admitting: Medical

## 2018-10-26 DIAGNOSIS — Z3A27 27 weeks gestation of pregnancy: Secondary | ICD-10-CM | POA: Insufficient documentation

## 2018-10-26 DIAGNOSIS — Z362 Encounter for other antenatal screening follow-up: Secondary | ICD-10-CM | POA: Insufficient documentation

## 2018-10-26 DIAGNOSIS — O99212 Obesity complicating pregnancy, second trimester: Secondary | ICD-10-CM

## 2018-10-27 ENCOUNTER — Other Ambulatory Visit (HOSPITAL_COMMUNITY): Payer: Self-pay | Admitting: *Deleted

## 2018-10-27 DIAGNOSIS — Z362 Encounter for other antenatal screening follow-up: Secondary | ICD-10-CM

## 2018-11-01 ENCOUNTER — Other Ambulatory Visit: Payer: BC Managed Care – PPO

## 2018-11-01 ENCOUNTER — Encounter: Payer: Self-pay | Admitting: Obstetrics and Gynecology

## 2018-11-01 ENCOUNTER — Ambulatory Visit (INDEPENDENT_AMBULATORY_CARE_PROVIDER_SITE_OTHER): Payer: BC Managed Care – PPO | Admitting: Obstetrics and Gynecology

## 2018-11-01 VITALS — BP 122/86 | HR 120 | Wt >= 6400 oz

## 2018-11-01 DIAGNOSIS — Z23 Encounter for immunization: Secondary | ICD-10-CM | POA: Diagnosis not present

## 2018-11-01 DIAGNOSIS — Z3400 Encounter for supervision of normal first pregnancy, unspecified trimester: Secondary | ICD-10-CM

## 2018-11-01 NOTE — Progress Notes (Signed)
Pt is here for ROB/gtt. G1P0 6366w2d.

## 2018-11-01 NOTE — Progress Notes (Signed)
   PRENATAL VISIT NOTE  Subjective:  Kelly Lam is a 22 y.o. G1P0 at 6658w2d being seen today for ongoing prenatal care.  She is currently monitored for the following issues for this high-risk pregnancy and has Encounter for supervision of normal pregnancy, unspecified, unspecified trimester and Morbid obesity (HCC) on their problem list.  Patient reports no complaints.  Contractions: Not present. Vag. Bleeding: None.  Movement: Present. Denies leaking of fluid.   The following portions of the patient's history were reviewed and updated as appropriate: allergies, current medications, past family history, past medical history, past social history, past surgical history and problem list. Problem list updated.  Objective:   Vitals:   11/01/18 0820  BP: 122/86  Pulse: (!) 120  Weight: (!) 403 lb 3.2 oz (182.9 kg)    Fetal Status: Fetal Heart Rate (bpm): 157 Fundal Height: 30 cm Movement: Present     General:  Alert, oriented and cooperative. Patient is in no acute distress.  Skin: Skin is warm and dry. No rash noted.   Cardiovascular: Normal heart rate noted  Respiratory: Normal respiratory effort, no problems with respiration noted  Abdomen: Soft, gravid, appropriate for gestational age.  Pain/Pressure: Present     Pelvic: Cervical exam deferred        Extremities: Normal range of motion.  Edema: None  Mental Status: Normal mood and affect. Normal behavior. Normal judgment and thought content.   Assessment and Plan:  Pregnancy: G1P0 at 4258w2d  1. Supervision of normal first pregnancy, antepartum Patient is doing well without complaints Third trimester labs, glucola and tdap today Follow up fetal echo 11/18 Follow up growth ultrasound scheduled - Glucose Tolerance, 2 Hours w/1 Hour - CBC - RPR - HIV Antibody (routine testing w rflx) - Tdap vaccine greater than or equal to 7yo IM  2. Morbid obesity (HCC) Continue ASA  Preterm labor symptoms and general obstetric precautions  including but not limited to vaginal bleeding, contractions, leaking of fluid and fetal movement were reviewed in detail with the patient. Please refer to After Visit Summary for other counseling recommendations.  Return in about 2 weeks (around 11/15/2018) for ROB.  Future Appointments  Date Time Provider Department Center  11/01/2018  9:30 AM Cerina Leary, Gigi GinPeggy, MD [facility] None  11/23/2018  4:00 PM [facility] US 3 [facility]    Catalina AntiguaPeggy Sheritta Deeg, MD

## 2018-11-02 LAB — CBC
HEMATOCRIT: 37.1 % (ref 34.0–46.6)
HEMOGLOBIN: 12.9 g/dL (ref 11.1–15.9)
MCH: 29.9 pg (ref 26.6–33.0)
MCHC: 34.8 g/dL (ref 31.5–35.7)
MCV: 86 fL (ref 79–97)
Platelets: 265 10*3/uL (ref 150–450)
RBC: 4.31 x10E6/uL (ref 3.77–5.28)
RDW: 13 % (ref 12.3–15.4)
WBC: 7.8 10*3/uL (ref 3.4–10.8)

## 2018-11-02 LAB — GLUCOSE TOLERANCE, 2 HOURS W/ 1HR
GLUCOSE, 1 HOUR: 177 mg/dL (ref 65–179)
GLUCOSE, 2 HOUR: 130 mg/dL (ref 65–152)
Glucose, Fasting: 85 mg/dL (ref 65–91)

## 2018-11-02 LAB — RPR: RPR: NONREACTIVE

## 2018-11-02 LAB — HIV ANTIBODY (ROUTINE TESTING W REFLEX): HIV Screen 4th Generation wRfx: NONREACTIVE

## 2018-11-07 ENCOUNTER — Encounter (HOSPITAL_COMMUNITY): Payer: Self-pay

## 2018-11-08 ENCOUNTER — Telehealth: Payer: Self-pay | Admitting: *Deleted

## 2018-11-08 NOTE — Telephone Encounter (Signed)
Pt called to office for glucose testing results.  LM on VM making pt aware of normal results.

## 2018-11-16 ENCOUNTER — Ambulatory Visit (INDEPENDENT_AMBULATORY_CARE_PROVIDER_SITE_OTHER): Payer: BC Managed Care – PPO | Admitting: Obstetrics and Gynecology

## 2018-11-16 ENCOUNTER — Encounter: Payer: Self-pay | Admitting: Obstetrics and Gynecology

## 2018-11-16 DIAGNOSIS — Z3A30 30 weeks gestation of pregnancy: Secondary | ICD-10-CM

## 2018-11-16 DIAGNOSIS — Z148 Genetic carrier of other disease: Secondary | ICD-10-CM | POA: Insufficient documentation

## 2018-11-16 DIAGNOSIS — Z3403 Encounter for supervision of normal first pregnancy, third trimester: Secondary | ICD-10-CM

## 2018-11-16 DIAGNOSIS — Z3400 Encounter for supervision of normal first pregnancy, unspecified trimester: Secondary | ICD-10-CM

## 2018-11-16 NOTE — Progress Notes (Signed)
Subjective:  Kelly Lam is a 22 y.o. G1P0 at 425w3d being seen today for ongoing prenatal care.  She is currently monitored for the following issues for this high-risk pregnancy and has Encounter for supervision of normal pregnancy, unspecified, unspecified trimester; Morbid obesity (HCC); and Genetic carrier on their problem list.  Patient reports general discomforts of pregnancy.  Contractions: Not present. Vag. Bleeding: None.  Movement: Present. Denies leaking of fluid.   The following portions of the patient's history were reviewed and updated as appropriate: allergies, current medications, past family history, past medical history, past social history, past surgical history and problem list. Problem list updated.  Objective:   Vitals:   11/16/18 1002  BP: 130/83  Pulse: (!) 112  Weight: (!) 403 lb (182.8 kg)    Fetal Status: Fetal Heart Rate (bpm): 140   Movement: Present     General:  Alert, oriented and cooperative. Patient is in no acute distress.  Skin: Skin is warm and dry. No rash noted.   Cardiovascular: Normal heart rate noted  Respiratory: Normal respiratory effort, no problems with respiration noted  Abdomen: Soft, gravid, appropriate for gestational age. Pain/Pressure: Present     Pelvic:  Cervical exam deferred        Extremities: Normal range of motion.  Edema: None  Mental Status: Normal mood and affect. Normal behavior. Normal judgment and thought content.   Urinalysis:      Assessment and Plan:  Pregnancy: G1P0 at 745w3d  1. Genetic carrier Fetal ECHO normal Considering postnatal testing  2. Supervision of normal first pregnancy, antepartum Stable U/S next week  3. Morbid obesity (HCC) Wt gain min Pt congratulated on this Continue with qdBASA  Preterm labor symptoms and general obstetric precautions including but not limited to vaginal bleeding, contractions, leaking of fluid and fetal movement were reviewed in detail with the patient. Please refer  to After Visit Summary for other counseling recommendations.  Return in about 2 weeks (around 11/30/2018) for OB visit.   Hermina StaggersErvin, Zavia Pullen L, MD

## 2018-11-23 ENCOUNTER — Other Ambulatory Visit: Payer: Self-pay

## 2018-11-23 ENCOUNTER — Telehealth: Payer: Self-pay

## 2018-11-23 ENCOUNTER — Encounter (HOSPITAL_COMMUNITY): Payer: Self-pay

## 2018-11-23 ENCOUNTER — Ambulatory Visit (HOSPITAL_COMMUNITY)
Admission: RE | Admit: 2018-11-23 | Discharge: 2018-11-23 | Disposition: A | Discharge status: Home / Self Care | Payer: BC Managed Care – PPO | Source: Ambulatory Visit | Attending: Family Medicine | Admitting: Family Medicine

## 2018-11-23 DIAGNOSIS — Z362 Encounter for other antenatal screening follow-up: Secondary | ICD-10-CM | POA: Diagnosis not present

## 2018-11-23 DIAGNOSIS — Z3A31 31 weeks gestation of pregnancy: Secondary | ICD-10-CM | POA: Diagnosis not present

## 2018-11-23 DIAGNOSIS — Z3400 Encounter for supervision of normal first pregnancy, unspecified trimester: Secondary | ICD-10-CM

## 2018-11-23 DIAGNOSIS — O99213 Obesity complicating pregnancy, third trimester: Secondary | ICD-10-CM | POA: Insufficient documentation

## 2018-11-23 MED ORDER — BREAST PUMP MISC: 1.0000 | 0 refills | Status: DC | PRN

## 2018-11-23 NOTE — Telephone Encounter (Signed)
Left vm advising that rx for breast pump was faxed today.

## 2018-11-23 NOTE — ED Notes (Signed)
Heart rate confirmed by palp and pulse ox: 135, O2 sat 97%.

## 2018-11-24 ENCOUNTER — Other Ambulatory Visit (HOSPITAL_COMMUNITY): Payer: Self-pay | Admitting: *Deleted

## 2018-11-24 DIAGNOSIS — Z362 Encounter for other antenatal screening follow-up: Secondary | ICD-10-CM

## 2018-12-01 ENCOUNTER — Ambulatory Visit (INDEPENDENT_AMBULATORY_CARE_PROVIDER_SITE_OTHER): Payer: BC Managed Care – PPO | Admitting: Obstetrics and Gynecology

## 2018-12-01 ENCOUNTER — Encounter: Payer: Self-pay | Admitting: Obstetrics and Gynecology

## 2018-12-01 VITALS — BP 144/77 | HR 114 | Wt >= 6400 oz

## 2018-12-01 DIAGNOSIS — O133 Gestational [pregnancy-induced] hypertension without significant proteinuria, third trimester: Secondary | ICD-10-CM

## 2018-12-01 DIAGNOSIS — O139 Gestational [pregnancy-induced] hypertension without significant proteinuria, unspecified trimester: Secondary | ICD-10-CM

## 2018-12-01 DIAGNOSIS — Z148 Genetic carrier of other disease: Secondary | ICD-10-CM

## 2018-12-01 DIAGNOSIS — Z3400 Encounter for supervision of normal first pregnancy, unspecified trimester: Secondary | ICD-10-CM

## 2018-12-01 DIAGNOSIS — Z3403 Encounter for supervision of normal first pregnancy, third trimester: Secondary | ICD-10-CM

## 2018-12-01 HISTORY — DX: Gestational (pregnancy-induced) hypertension without significant proteinuria, unspecified trimester: O13.9

## 2018-12-01 NOTE — Progress Notes (Signed)
   PRENATAL VISIT NOTE  Subjective:  Kelly Lam is a 22 y.o. G1P0 at 4w4dbeing seen today for ongoing prenatal care.  She is currently monitored for the following issues for this high-risk pregnancy and has Encounter for supervision of normal pregnancy, unspecified, unspecified trimester; [place]bid [place] ([place]; Genetic carrier; and Gestational hypertension on their problem list.  Patient reports increased clear discharge, denies itching or burning.  Contractions: Irregular. Vag. Bleeding: None.  Movement: Present. Denies leaking of fluid.   The following portions of the patient's history were reviewed and updated as appropriate: allergies, current medications, past family history, past medical history, past social history, past surgical history and problem list. Problem list updated.  Objective:   Vitals:   12/01/18 1533  BP: (!) 144/77  Pulse: (!) 114  Weight: (!) 402 lb 11.2 oz (182.7 kg)    Fetal Status: Fetal Heart Rate (bpm): 125   Movement: Present     General:  Alert, oriented and cooperative. Patient is in no acute distress.  Skin: Skin is warm and dry. No rash noted.   Cardiovascular: Normal heart rate noted  Respiratory: Normal respiratory effort, no problems with respiration noted  Abdomen: Soft, gravid, appropriate for gestational age.  Pain/Pressure: Present     Pelvic: Cervical exam deferred        Extremities: Normal range of motion.  Edema: None  Mental Status: Normal mood and affect. Normal behavior. Normal judgment and thought content.   Assessment and Plan:  Pregnancy: G1P0 at 365w4d1. Supervision of normal first pregnancy, antepartum Reviewed options for contraception, gave info  2. Genetic carrier SMA   3. Morbid obesity ([place] 4. Gestational hypertension, third trimester Now gestational HTN, reviewed s/s, will plan for IOL at 37 weeks PEC labs sent today - CBC - Comp Met (CMET) - Protein / creatinine ratio, urine   Preterm labor symptoms and  general obstetric precautions including but not limited to vaginal bleeding, contractions, leaking of fluid and fetal movement were reviewed in detail with the patient. Please refer to After Visit Summary for other counseling recommendations.  Return in about 2 weeks (around 12/15/2018) for OB visit (MD).  Future Appointments  Date Time Provider De[place]1/02/2019  8:30 AM [facility] [place] [facility]    KeSloan LeiterMD

## 2018-12-01 NOTE — Patient Instructions (Addendum)
Take blood pressuer reading at home, please call the office if the top number is > 160, or the bottom number is > 100.     Contraception Choices Contraception, also called birth control, refers to methods or devices that prevent pregnancy. Hormonal methods Contraceptive implant A contraceptive implant is a thin, plastic tube that contains a hormone. It is inserted into the upper part of the arm. It can remain in place for up to 3 years. Progestin-only injections Progestin-only injections are injections of progestin, a synthetic form of the hormone progesterone. They are given every 3 months by a health care provider. Birth control pills Birth control pills are pills that contain hormones that prevent pregnancy. They must be taken once a day, preferably at the same time each day. Birth control patch The birth control patch contains hormones that prevent pregnancy. It is placed on the skin and must be changed once a week for three weeks and removed on the fourth week. A prescription is needed to use this method of contraception. Vaginal ring A vaginal ring contains hormones that prevent pregnancy. It is placed in the vagina for three weeks and removed on the fourth week. After that, the process is repeated with a new ring. A prescription is needed to use this method of contraception. Emergency contraceptive Emergency contraceptives prevent pregnancy after unprotected sex. They come in pill form and can be taken up to 5 days after sex. They work best the sooner they are taken after having sex. Most emergency contraceptives are available without a prescription. This method should not be used as your only form of birth control. Barrier methods Female condom A female condom is a thin sheath that is worn over the penis during sex. Condoms keep sperm from going inside a woman's body. They can be used with a spermicide to increase their effectiveness. They should be disposed after a single use. Female  condom A female condom is a soft, loose-fitting sheath that is put into the vagina before sex. The condom keeps sperm from going inside a woman's body. They should be disposed after a single use.  Intrauterine contraception Intrauterine device (IUD) An IUD is a T-shaped device that is put in a woman's uterus. There are two types:  Hormone IUD.This type contains progestin, a synthetic form of the hormone progesterone. This type can stay in place for 3-5 years.  Copper IUD.This type is wrapped in copper wire. It can stay in place for 10 years.  Permanent methods of contraception Female tubal ligation In this method, a woman's fallopian tubes are sealed, tied, or blocked during surgery to prevent eggs from traveling to the uterus.  Female sterilization This is a procedure to tie off the tubes that carry sperm (vasectomy). After the procedure, the man can still ejaculate fluid (semen).  Summary  Contraception, also called birth control, means methods or devices that prevent pregnancy.  Hormonal methods of contraception include implants, injections, pills, patches, vaginal rings, and emergency contraceptives.  Barrier methods of contraception can include female condoms, female condoms, diaphragms, cervical caps, sponges, and spermicides.  There are two types of IUDs (intrauterine devices). An IUD can be put in a woman's uterus to prevent pregnancy for 3-5 years.  Permanent sterilization can be done through a procedure for males, females, or both. This information is not intended to replace advice given to you by your health care provider. Make sure you discuss any questions you have with your health care provider. Document Released: 12/07/2005 Document Revised: 01/09/2017  Document Reviewed: 01/09/2017   Intrauterine Device Information An intrauterine device (IUD) is inserted into your uterus to prevent pregnancy. There are two types of IUDs available:  Copper IUD-This type of IUD is  wrapped in copper wire and is placed inside the uterus. Copper makes the uterus and fallopian tubes produce a fluid that kills sperm. The copper IUD can stay in place for 10 years.  Hormone IUD-This type of IUD contains the hormone progestin (synthetic progesterone). The hormone thickens the cervical mucus and prevents sperm from entering the uterus. It also thins the uterine lining to prevent implantation of a fertilized egg. The hormone can weaken or kill the sperm that get into the uterus. One type of hormone IUD can stay in place for 5 years, and another type can stay in place for 3 years.  Your health care provider will make sure you are a good candidate for a contraceptive IUD. Discuss with your health care provider the possible side effects. Advantages of an intrauterine device  IUDs are highly effective, reversible, long acting, and low maintenance.  There are no estrogen-related side effects.  An IUD can be used when breastfeeding.  IUDs are not associated with weight gain.  The copper IUD works immediately after insertion.  The hormone IUD works right away if inserted within 7 days of your period starting. You will need to use a backup method of birth control for 7 days if the hormone IUD is inserted at any other time in your cycle.  The copper IUD does not interfere with your female hormones.  The hormone IUD can make heavy menstrual periods lighter and decrease cramping.  The hormone IUD can be used for 3 or 5 years.  The copper IUD can be used for 10 years. Disadvantages of an intrauterine device  The hormone IUD can be associated with irregular bleeding patterns.  The copper IUD can make your menstrual flow heavier and more painful.  You may experience cramping and vaginal bleeding after insertion. This information is not intended to replace advice given to you by your health care provider. Make sure you discuss any questions you have with your health care  provider. Document Released: 11/10/2004 Document Revised: 05/14/2016 Document Reviewed: 05/28/2013 Elsevier Interactive Patient Education  2017 [employer].  Risk analyst Patient Education  [employer].

## 2018-12-01 NOTE — Progress Notes (Signed)
ROB.  Reports no problems today. 

## 2018-12-02 LAB — CBC
HEMATOCRIT: 40 % (ref 34.0–46.6)
HEMOGLOBIN: 13.7 g/dL (ref 11.1–15.9)
MCH: 29.7 pg (ref 26.6–33.0)
MCHC: 34.3 g/dL (ref 31.5–35.7)
MCV: 87 fL (ref 79–97)
Platelets: 285 10*3/uL (ref 150–450)
RBC: 4.62 x10E6/uL (ref 3.77–5.28)
RDW: 13.1 % (ref 12.3–15.4)
WBC: 9.1 10*3/uL (ref 3.4–10.8)

## 2018-12-02 LAB — COMPREHENSIVE METABOLIC PANEL
ALBUMIN: 3.6 g/dL (ref 3.5–5.5)
ALT: 20 IU/L (ref 0–32)
AST: 11 IU/L (ref 0–40)
Albumin/Globulin Ratio: 1.3 (ref 1.2–2.2)
Alkaline Phosphatase: 85 IU/L (ref 39–117)
BILIRUBIN TOTAL: 0.3 mg/dL (ref 0.0–1.2)
BUN / CREAT RATIO: 10 (ref 9–23)
BUN: 5 mg/dL — ABNORMAL LOW (ref 6–20)
CHLORIDE: 105 mmol/L (ref 96–106)
CO2: 19 mmol/L — ABNORMAL LOW (ref 20–29)
Calcium: 9.4 mg/dL (ref 8.7–10.2)
Creatinine, Ser: 0.49 mg/dL — ABNORMAL LOW (ref 0.57–1.00)
GFR calc Af Amer: 162 mL/min/{1.73_m2} (ref >59)
GFR calc non Af Amer: 141 mL/min/{1.73_m2} (ref >59)
GLOBULIN, TOTAL: 2.8 g/dL (ref 1.5–4.5)
Glucose: 122 mg/dL — ABNORMAL HIGH (ref 65–99)
POTASSIUM: 4.1 mmol/L (ref 3.5–5.2)
SODIUM: 142 mmol/L (ref 134–144)
Total Protein: 6.4 g/dL (ref 6.0–8.5)

## 2018-12-02 LAB — PROTEIN / CREATININE RATIO, URINE
CREATININE, UR: 126.9 mg/dL
Protein, Ur: 13.3 mg/dL
Protein/Creat Ratio: 105 mg/g creat (ref 0–200)

## 2018-12-16 ENCOUNTER — Ambulatory Visit (INDEPENDENT_AMBULATORY_CARE_PROVIDER_SITE_OTHER): Payer: BC Managed Care – PPO | Admitting: Obstetrics

## 2018-12-16 ENCOUNTER — Encounter: Payer: Self-pay | Admitting: Obstetrics

## 2018-12-16 VITALS — BP 143/90 | HR 119 | Wt >= 6400 oz

## 2018-12-16 DIAGNOSIS — O0990 Supervision of high risk pregnancy, unspecified, unspecified trimester: Secondary | ICD-10-CM

## 2018-12-16 DIAGNOSIS — Z148 Genetic carrier of other disease: Secondary | ICD-10-CM

## 2018-12-16 DIAGNOSIS — O0993 Supervision of high risk pregnancy, unspecified, third trimester: Secondary | ICD-10-CM

## 2018-12-16 DIAGNOSIS — O133 Gestational [pregnancy-induced] hypertension without significant proteinuria, third trimester: Secondary | ICD-10-CM

## 2018-12-16 NOTE — Progress Notes (Signed)
Pt presents for ROB has questions about IOL at  Subjective:  Kelly Lam is a 22 y.o. G1P0 at 4730w0d being seen today for ongoing prenatal care.  She is currently monitored for the following issues for this high-risk pregnancy and has Encounter for supervision of normal pregnancy, unspecified, unspecified trimester; Morbid obesity (HCC); Genetic carrier; and Gestational hypertension on their problem list.  Patient reports no complaints.  Contractions: Irregular. Vag. Bleeding: None.  Movement: Present. Denies leaking of fluid.   The following portions of the patient's history were reviewed and updated as appropriate: allergies, current medications, past family history, past medical history, past social history, past surgical history and problem list. Problem list updated.  Objective:   Vitals:   12/16/18 1053  BP: (!) 143/90  Pulse: (!) 119  Weight: (!) 406 lb 14.4 oz (184.6 kg)    Fetal Status:     Movement: Present     General:  Alert, oriented and cooperative. Patient is in no acute distress.  Skin: Skin is warm and dry. No rash noted.   Cardiovascular: Normal heart rate noted  Respiratory: Normal respiratory effort, no problems with respiration noted  Abdomen: Soft, gravid, appropriate for gestational age. Pain/Pressure: Present     Pelvic:  Cervical exam deferred        Extremities: Normal range of motion.  Edema: None  Mental Status: Normal mood and affect. Normal behavior. Normal judgment and thought content.   Urinalysis:      Assessment and Plan:  Pregnancy: G1P0 at 930w0d  1. Supervision of high risk pregnancy, antepartum  2. Morbid obesity (HCC)  3. Gestational hypertension, third trimester - stable  4. 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  Plans postnatal testing.  Preterm labor symptoms and general obstetric precautions including but not limited to vaginal bleeding, contractions, leaking of fluid and fetal movement were reviewed in detail with the patient. Please  refer to After Visit Summary for other counseling recommendations.  No follow-ups on file.   Brock BadHarper, Charles A, MD37 wks.

## 2018-12-21 NOTE — L&D Delivery Note (Addendum)
Patient: NADELINE BLAES MRN: [id number]  GBS status: positive, treated with PCN adequately  Patient is a 23 y.o. now G1P1 s/p NSVD at [redacted]w[redacted]d, who was admitted for IOL for gHTN. AROM 14h 75m prior to delivery with clear fluid.   Delivery Note At 8:24 AM a viable female was delivered via Vaginal, Spontaneous (Presentation: OA ;  ).  APGAR: 8, 9; weight 3160   Placenta status: intact, spontaneous.  Cord: 3 vessel   Head delivered OA. nuchal cord present, not reducible, delivered through. Shoulder and body delivered in usual fashion. Infant with spontaneous cry, placed on mother's abdomen, dried and bulb suctioned. Cord clamped x 2 after 1-minute delay, and cut by family member. Cord blood drawn. Placenta delivered spontaneously with gentle cord traction. Fundus firm with massage and Pitocin. Perineum inspected and found to have a second degree laceration, which was repaired with good hemostasis achieved.  Anesthesia: epidural, local lidocaine for repair Episiotomy: None Lacerations: 2nd degree;Perineal Suture Repair: 3.0 vicryl  Est. Blood Loss (mL):   Mom to postpartum.  Baby to Nursery.  Sigurd Sos Deloglos, Midwife Student  01/03/2019, 8:57 AM  Attestation: I have seen this patient and agree with the midwife student's documentation. I was present and gloved throughout delivery and assisted with the repair.   Cristal Deer. Earlene Plater, DO OB/GYN Fellow

## 2018-12-23 ENCOUNTER — Ambulatory Visit (HOSPITAL_COMMUNITY)
Admission: RE | Admit: 2018-12-23 | Discharge: 2018-12-23 | Disposition: A | Discharge status: Home / Self Care | Payer: BC Managed Care – PPO | Source: Ambulatory Visit | Attending: Maternal & Fetal Medicine | Admitting: Maternal & Fetal Medicine

## 2018-12-23 ENCOUNTER — Ambulatory Visit (INDEPENDENT_AMBULATORY_CARE_PROVIDER_SITE_OTHER): Payer: Medicaid Other | Admitting: Obstetrics & Gynecology

## 2018-12-23 ENCOUNTER — Encounter (HOSPITAL_COMMUNITY): Payer: Self-pay

## 2018-12-23 ENCOUNTER — Other Ambulatory Visit (HOSPITAL_COMMUNITY): Payer: Self-pay | Admitting: *Deleted

## 2018-12-23 ENCOUNTER — Other Ambulatory Visit (HOSPITAL_COMMUNITY): Payer: Self-pay | Admitting: Maternal & Fetal Medicine

## 2018-12-23 ENCOUNTER — Encounter: Payer: Self-pay | Admitting: Obstetrics & Gynecology

## 2018-12-23 VITALS — BP 136/68 | HR 111 | Wt >= 6400 oz

## 2018-12-23 DIAGNOSIS — Z362 Encounter for other antenatal screening follow-up: Secondary | ICD-10-CM

## 2018-12-23 DIAGNOSIS — O0990 Supervision of high risk pregnancy, unspecified, unspecified trimester: Secondary | ICD-10-CM

## 2018-12-23 DIAGNOSIS — O139 Gestational [pregnancy-induced] hypertension without significant proteinuria, unspecified trimester: Secondary | ICD-10-CM | POA: Diagnosis not present

## 2018-12-23 DIAGNOSIS — O99213 Obesity complicating pregnancy, third trimester: Secondary | ICD-10-CM | POA: Diagnosis not present

## 2018-12-23 DIAGNOSIS — O0993 Supervision of high risk pregnancy, unspecified, third trimester: Secondary | ICD-10-CM

## 2018-12-23 DIAGNOSIS — O10919 Unspecified pre-existing hypertension complicating pregnancy, unspecified trimester: Secondary | ICD-10-CM | POA: Diagnosis present

## 2018-12-23 DIAGNOSIS — Z3A35 35 weeks gestation of pregnancy: Secondary | ICD-10-CM

## 2018-12-23 NOTE — Progress Notes (Signed)
Provider will get FHT's.

## 2018-12-23 NOTE — Progress Notes (Signed)
   PRENATAL VISIT NOTE  Subjective:  Kelly Lam is a 23 y.o. G1P0 at [redacted]w[redacted]d being seen today for ongoing prenatal care.  She is currently monitored for the following issues for this high-risk pregnancy and has Supervision of high risk pregnancy, antepartum; Morbid obesity (HCC); Genetic carrier; and Gestational hypertension on their problem list.  Patient reports no complaints.  Contractions: Not present. Vag. Bleeding: None.  Movement: Present. Denies leaking of fluid.   The following portions of the patient's history were reviewed and updated as appropriate: allergies, current medications, past family history, past medical history, past social history, past surgical history and problem list. Problem list updated.  Objective:   Vitals:   12/23/18 0818  BP: 136/68  Pulse: (!) 111  Weight: (!) 403 lb (182.8 kg)    Fetal Status:     Movement: Present     General:  Alert, oriented and cooperative. Patient is in no acute distress.  Skin: Skin is warm and dry. No rash noted.   Cardiovascular: Normal heart rate noted  Respiratory: Normal respiratory effort, no problems with respiration noted  Abdomen: Soft, gravid, appropriate for gestational age.  Pain/Pressure: Present     Pelvic: Cervical exam deferred        Extremities: Normal range of motion.  Edema: None  Mental Status: Normal mood and affect. Normal behavior. Normal judgment and thought content.   Assessment and Plan:  Pregnancy: G1P0 at [redacted]w[redacted]d  There are no diagnoses linked to this encounter. Preterm labor symptoms and general obstetric precautions including but not limited to vaginal bleeding, contractions, leaking of fluid and fetal movement were reviewed in detail with the patient. Please refer to After Visit Summary for other counseling recommendations.  Return in about 1 week (around 12/30/2018). [facility]OL 37 weeks [id number] Future Appointments  Date Time Provider Department Center  12/29/2018  3:45 PM Adam PhenixArnold, James G, MD  [facility] None    Scheryl DarterJames Arnold, MD

## 2018-12-23 NOTE — Patient Instructions (Signed)
Labor Induction    Labor induction is when steps are taken to cause a pregnant woman to begin the labor process. Most women go into labor on their own between 37 weeks and 42 weeks of pregnancy. When this does not happen or when there is a medical need for labor to begin, steps may be taken to induce labor. Labor induction causes a pregnant woman's uterus to contract. It also causes the cervix to soften (ripen), open (dilate), and thin out (efface). Usually, labor is not induced before 39 weeks of pregnancy unless there is a medical reason to do so. Your health care provider will determine if labor induction is needed.  Before inducing labor, your health care provider will consider a number of factors, including:  · Your medical condition and your baby's.  · How many weeks along you are in your pregnancy.  · How mature your baby's lungs are.  · The condition of your cervix.  · The position of your baby.  · The size of your birth canal.  What are some reasons for labor induction?  Labor may be induced if:  · Your health or your baby's health is at risk.  · Your pregnancy is overdue by 1 week or more.  · Your water breaks but labor does not start on its own.  · There is a low amount of amniotic fluid around your baby.  You may also choose (elect) to have labor induced at a certain time. Generally, elective labor induction is done no earlier than 39 weeks of pregnancy.  What methods are used for labor induction?  Methods used for labor induction include:  · Prostaglandin medicine. This medicine starts contractions and causes the cervix to dilate and ripen. It can be taken by mouth (orally) or by being inserted into the vagina (suppository).  · Inserting a small, thin tube (catheter) with a balloon into the vagina and then expanding the balloon with water to dilate the cervix.  · Stripping the membranes. In this method, your health care provider gently separates amniotic sac tissue from the cervix. This causes the  cervix to stretch, which in turn causes the release of a hormone called progesterone. The hormone causes the uterus to contract. This procedure is often done during an office visit, after which you will be sent home to wait for contractions to begin.  · Breaking the water. In this method, your health care provider uses a small instrument to make a small hole in the amniotic sac. This eventually causes the amniotic sac to break. Contractions should begin after a few hours.  · Medicine to trigger or strengthen contractions. This medicine is given through an IV that is inserted into a vein in your arm.  Except for membrane stripping, which can be done in a clinic, labor induction is done in the hospital so that you and your baby can be carefully monitored.  How long does it take for labor to be induced?  The length of time it takes to induce labor depends on how ready your body is for labor. Some inductions can take up to 2-3 days, while others may take less than a day. Induction may take longer if:  · You are induced early in your pregnancy.  · It is your first pregnancy.  · Your cervix is not ready.  What are some risks associated with labor induction?  Some risks associated with labor induction include:  · Changes in fetal heart rate, such as being too   high, too low, or irregular (erratic).  · Failed induction.  · Infection in the mother or the baby.  · Increased risk of having a cesarean delivery.  · Fetal death.  · Breaking off (abruption) of the placenta from the uterus (rare).  · Rupture of the uterus (very rare).  When induction is needed for medical reasons, the benefits of induction generally outweigh the risks.  What are some reasons for not inducing labor?  Labor induction should not be done if:  · Your baby does not tolerate contractions.  · You have had previous surgeries on your uterus, such as a myomectomy, removal of fibroids, or a vertical scar from a previous cesarean delivery.  · Your placenta lies  very low in your uterus and blocks the opening of the cervix (placenta previa).  · Your baby is not in a head-down position.  · The umbilical cord drops down into the birth canal in front of the baby.  · There are unusual circumstances, such as the baby being very early (premature).  · You have had more than 2 previous cesarean deliveries.  Summary  · Labor induction is when steps are taken to cause a pregnant woman to begin the labor process.  · Labor induction causes a pregnant woman's uterus to contract. It also causes the cervix to ripen, dilate, and efface.  · Labor is not induced before 39 weeks of pregnancy unless there is a medical reason to do so.  · When induction is needed for medical reasons, the benefits of induction generally outweigh the risks.  This information is not intended to replace advice given to you by your health care provider. Make sure you discuss any questions you have with your health care provider.  Document Released: 04/28/2007 Document Revised: 01/20/2017 Document Reviewed: 01/20/2017  Elsevier Interactive Patient Education © 2019 [employer]

## 2018-12-25 ENCOUNTER — Other Ambulatory Visit: Payer: Self-pay | Admitting: Family Medicine

## 2018-12-27 ENCOUNTER — Other Ambulatory Visit: Payer: Self-pay

## 2018-12-27 ENCOUNTER — Encounter (HOSPITAL_COMMUNITY): Payer: Self-pay

## 2018-12-27 ENCOUNTER — Inpatient Hospital Stay (HOSPITAL_COMMUNITY)
Admission: AD | Admit: 2018-12-27 | Discharge: 2018-12-27 | Disposition: A | Discharge status: Home / Self Care | Payer: BC Managed Care – PPO | Source: Ambulatory Visit | Attending: Obstetrics & Gynecology | Admitting: Obstetrics & Gynecology

## 2018-12-27 DIAGNOSIS — Z3A36 36 weeks gestation of pregnancy: Secondary | ICD-10-CM | POA: Diagnosis not present

## 2018-12-27 DIAGNOSIS — O368130 Decreased fetal movements, third trimester, not applicable or unspecified: Secondary | ICD-10-CM | POA: Diagnosis present

## 2018-12-27 DIAGNOSIS — O133 Gestational [pregnancy-induced] hypertension without significant proteinuria, third trimester: Secondary | ICD-10-CM | POA: Insufficient documentation

## 2018-12-27 DIAGNOSIS — Z3689 Encounter for other specified antenatal screening: Secondary | ICD-10-CM

## 2018-12-27 LAB — CBC WITH DIFFERENTIAL/PLATELET
Basophils Absolute: 0 10*3/uL (ref 0.0–0.1)
Basophils Relative: 0 %
Eosinophils Absolute: 0.1 10*3/uL (ref 0.0–0.5)
Eosinophils Relative: 1 %
HCT: 39 % (ref 36.0–46.0)
Hemoglobin: 12.9 g/dL (ref 12.0–15.0)
Lymphocytes Relative: 25 %
Lymphs Abs: 2.4 10*3/uL (ref 0.7–4.0)
MCH: 29.8 pg (ref 26.0–34.0)
MCHC: 33.1 g/dL (ref 30.0–36.0)
MCV: 90.1 fL (ref 80.0–100.0)
Monocytes Absolute: 0.5 10*3/uL (ref 0.1–1.0)
Monocytes Relative: 5 %
NEUTROS PCT: 69 %
Neutro Abs: 6.8 10*3/uL (ref 1.7–7.7)
Platelets: 242 10*3/uL (ref 150–400)
RBC: 4.33 MIL/uL (ref 3.87–5.11)
RDW: 13.6 % (ref 11.5–15.5)
WBC: 9.7 10*3/uL (ref 4.0–10.5)
nRBC: 0 % (ref 0.0–0.2)

## 2018-12-27 LAB — COMPREHENSIVE METABOLIC PANEL
ALK PHOS: 81 U/L (ref 38–126)
ALT: 24 U/L (ref 0–44)
AST: 15 U/L (ref 15–41)
Albumin: 3 g/dL — ABNORMAL LOW (ref 3.5–5.0)
Anion gap: 8 (ref 5–15)
BILIRUBIN TOTAL: 0.3 mg/dL (ref 0.3–1.2)
BUN: 8 mg/dL (ref 6–20)
CO2: 20 mmol/L — ABNORMAL LOW (ref 22–32)
Calcium: 8.6 mg/dL — ABNORMAL LOW (ref 8.9–10.3)
Chloride: 107 mmol/L (ref 98–111)
Creatinine, Ser: 0.44 mg/dL (ref 0.44–1.00)
GFR calc Af Amer: >60 mL/min (ref >60)
GFR calc non Af Amer: >60 mL/min (ref >60)
Glucose, Bld: 87 mg/dL (ref 70–99)
Potassium: 3.5 mmol/L (ref 3.5–5.1)
Sodium: 135 mmol/L (ref 135–145)
Total Protein: 6.5 g/dL (ref 6.5–8.1)

## 2018-12-27 LAB — URINALYSIS, ROUTINE W REFLEX MICROSCOPIC
Bilirubin Urine: NEGATIVE
Glucose, UA: NEGATIVE mg/dL
Hgb urine dipstick: NEGATIVE
Ketones, ur: NEGATIVE mg/dL
Nitrite: NEGATIVE
Protein, ur: NEGATIVE mg/dL
SPECIFIC GRAVITY, URINE: 1.014 (ref 1.005–1.030)
pH: 7 (ref 5.0–8.0)

## 2018-12-27 LAB — PROTEIN / CREATININE RATIO, URINE
Creatinine, Urine: 87 mg/dL
Total Protein, Urine: <6 mg/dL

## 2018-12-27 NOTE — MAU Note (Signed)
Pt states she last felt the baby move at 1000.   Pt laid on left-side and had some cold water.   Pt is scheduled for induction 01/01/2018  Denies vaginal bleeding or LOF.

## 2018-12-27 NOTE — MAU Provider Note (Addendum)
History     CSN: [id number]  Arrival date and time: 12/27/18 1753   First Provider Initiated Contact with Patient 12/27/18 1813      Chief Complaint  Patient presents with  . Decreased Fetal Movement   HPI Kelly Lam is a 23 y.o. G1P0 at 1955w2d who presents to MAU with chief complaint of decreased fetal movement. She states she has not felt the baby move since about 10am today. She tried repositioning and drinking cold water but was unsuccessful.   Patient's pregnancy is complicated by gestational hypertension. She denies headache, RUQ pain, visual disturbances and SOB.  OB History    Gravida  1   Para      Term      Preterm      AB      Living        SAB      TAB      Ectopic      Multiple      Live Births              Past Medical History:  Diagnosis Date  . Gestational hypertension 12/01/2018  . Morbid obesity with BMI of 50.0-59.9, adult (HCC)   . Urinary tract infection     Past Surgical History:  Procedure Laterality Date  . HERNIA REPAIR    . TONSILLECTOMY    . WISDOM TOOTH EXTRACTION      Family History  Problem Relation Age of Onset  . Hypertension Father   . Diabetes Maternal Grandfather   . Congestive Heart Failure Maternal Grandfather   . Cancer Maternal Grandfather   . Huntington's disease Paternal Grandfather     Social History   Tobacco Use  . Smoking status: Never Smoker  . Smokeless tobacco: Never Used  Substance Use Topics  . Alcohol use: No  . Drug use: Never    Allergies: No Known Allergies  Medications Prior to Admission  Medication Sig Dispense Refill Last Dose  . acetaminophen (TYLENOL) 325 MG tablet Take 325 mg by mouth every 6 (six) hours as needed for headache.   Taking  . aspirin 81 MG chewable tablet Chew by mouth daily.   Taking  . Calcium Carbonate-Vit D-Min (CALCIUM 1200 PO) Take by mouth.   Taking  . diphenhydrAMINE-APAP, sleep, (TYLENOL PM EXTRA STRENGTH PO) Take by mouth.   Taking  . Prenatal  Vit-Fe Fumarate-FA (MULTIVITAMIN-PRENATAL) 27-0.8 MG TABS tablet Take 1 tablet by mouth daily at 12 noon.   Taking    Review of Systems  Respiratory: Negative for shortness of breath.   Gastrointestinal: Negative for abdominal pain.  Genitourinary: Negative for difficulty urinating, dyspareunia, dysuria, flank pain, vaginal bleeding, vaginal discharge and vaginal pain.  Musculoskeletal: Negative for back pain.  Neurological: Negative for headaches.  All other systems reviewed and are negative.  Physical Exam   Blood pressure 131/81, pulse (!) 108, temperature 97.7 F (36.5 C), temperature source Oral, resp. rate 16, height 5\' 8"  (1.727 m), weight (!) 184.2 kg, last menstrual period 04/17/2018, SpO2 100 %.  Physical Exam  Nursing note and vitals reviewed. Constitutional: She is oriented to person, place, and time. She appears well-developed and well-nourished.  Cardiovascular: Normal rate.  Respiratory: Effort normal.  GI: Soft. She exhibits no distension. There is no abdominal tenderness. There is no rebound and no guarding.  Musculoskeletal: Normal range of motion.  Neurological: She is alert and oriented to person, place, and time. She has normal strength. No cranial nerve deficit or  sensory deficit.  Skin: Skin is dry.  Psychiatric: She has a normal mood and affect. Her behavior is normal. Judgment and thought content normal.    MAU Course/MDM   --Elevated BP during evaluation for DFM, no severe range and no severe symptoms --Reactive fetal tracing: baseline 130, moderate variability, positive accels, no decels --Toco: quiet  Patient Vitals for the past 24 hrs:  BP Temp Temp src Pulse Resp SpO2 Height Weight  12/27/18 1945 131/81 - - (!) 108 - - - -  12/27/18 1930 122/85 - - 99 - - - -  12/27/18 1915 136/84 - - 99 - - - -  12/27/18 1907 129/85 - - (!) 116 - - - -  12/27/18 1845 137/87 - - (!) 106 - - - -  12/27/18 1837 127/83 - - (!) 101 - - - -  12/27/18 1815 140/83 - -  (!) 106 - 100 % - -  12/27/18 1806 (!) 147/85 97.7 F (36.5 C) Oral (!) 110 16 100 % 5\' 8"  (1.727 m) (!) 184.2 kg    Results for orders placed or performed during the hospital encounter of 12/27/18 (from the past 24 hour(s))  CBC with Differential/Platelet     Status: None   Collection Time: 12/27/18  6:44 PM  Result Value Ref Range   WBC 9.7 4.0 - 10.5 K/uL   RBC 4.33 3.87 - 5.11 MIL/uL   Hemoglobin 12.9 12.0 - 15.0 g/dL   HCT 78.2 95.6 - 21.3 %   MCV 90.1 80.0 - 100.0 fL   MCH 29.8 26.0 - 34.0 pg   MCHC 33.1 30.0 - 36.0 g/dL   RDW 08.6 57.8 - 46.9 %   Platelets 242 150 - 400 K/uL   nRBC 0.0 0.0 - 0.2 %   Neutrophils Relative % 69 %   Neutro Abs 6.8 1.7 - 7.7 K/uL   Lymphocytes Relative 25 %   Lymphs Abs 2.4 0.7 - 4.0 K/uL   Monocytes Relative 5 %   Monocytes Absolute 0.5 0.1 - 1.0 K/uL   Eosinophils Relative 1 %   Eosinophils Absolute 0.1 0.0 - 0.5 K/uL   Basophils Relative 0 %   Basophils Absolute 0.0 0.0 - 0.1 K/uL  Comprehensive metabolic panel     Status: Abnormal   Collection Time: 12/27/18  6:44 PM  Result Value Ref Range   Sodium 135 135 - 145 mmol/L   Potassium 3.5 3.5 - 5.1 mmol/L   Chloride 107 98 - 111 mmol/L   CO2 20 (L) 22 - 32 mmol/L   Glucose, Bld 87 70 - 99 mg/dL   BUN 8 6 - 20 mg/dL   Creatinine, Ser 6.29 0.44 - 1.00 mg/dL   Calcium 8.6 (L) 8.9 - 10.3 mg/dL   Total Protein 6.5 6.5 - 8.1 g/dL   Albumin 3.0 (L) 3.5 - 5.0 g/dL   AST 15 15 - 41 U/L   ALT 24 0 - 44 U/L   Alkaline Phosphatase 81 38 - 126 U/L   Total Bilirubin 0.3 0.3 - 1.2 mg/dL   GFR calc non Af Amer >60 >60 mL/min   GFR calc Af Amer >60 >60 mL/min   Anion gap 8 5 - 15  Urinalysis, Routine w reflex microscopic     Status: Abnormal   Collection Time: 12/27/18  7:09 PM  Result Value Ref Range   Color, Urine YELLOW YELLOW   APPearance HAZY (A) CLEAR   Specific Gravity, Urine 1.014 1.005 - 1.030   pH  7.0 5.0 - 8.0   Glucose, UA NEGATIVE NEGATIVE mg/dL   Hgb urine dipstick NEGATIVE  NEGATIVE   Bilirubin Urine NEGATIVE NEGATIVE   Ketones, ur NEGATIVE NEGATIVE mg/dL   Protein, ur NEGATIVE NEGATIVE mg/dL   Nitrite NEGATIVE NEGATIVE   Leukocytes, UA TRACE (A) NEGATIVE   RBC / HPF 0-5 0 - 5 RBC/hpf   WBC, UA 0-5 0 - 5 WBC/hpf   Bacteria, UA RARE (A) NONE SEEN   Squamous Epithelial / LPF 0-5 0 - 5   Amorphous Crystal PRESENT   Protein / creatinine ratio, urine     Status: None   Collection Time: 12/27/18  7:09 PM  Result Value Ref Range   Creatinine, Urine 87.00 mg/dL   Total Protein, Urine <6 mg/dL   Protein Creatinine Ratio        0.00 - 0.15 mg/mg[Cre]    Report given to Thressa ShellerHeather Cash Meadow, CNM, DNP who assumes care at this time  Clayton BiblesSamantha Weinhold, [place]CNM 12/27/18  8:49 PM    Assessment and Plan   1. Gestational hypertension, third trimester   2. [redacted] weeks gestation of pregnancy   3. NST (non-stress test) reactive    No proteinuria No severe features Patient has office visit on 1/9, and IOL scheduled for 1/12  DC home Comfort measures reviewed  3rd Trimester precautions  Pre-eclampsia warning signs  PTL precautions  Fetal kick counts RX: none  Return to MAU as needed FU with OB as planned  Follow-up Information    [facility] Follow up.   Contact information: [address] [place] [phone]          Thressa ShellerHeather Shiven Junious DNP, CNM  12/27/18  8:52 PM

## 2018-12-27 NOTE — Discharge Instructions (Signed)
.  Preeclampsia and Eclampsia    Preeclampsia is a serious condition that may develop during pregnancy. It is also called toxemia of pregnancy. This condition causes high blood pressure along with other symptoms, such as swelling and headaches. These symptoms may develop as the condition gets worse. Preeclampsia may occur at 20 weeks of pregnancy or later.  Diagnosing and treating preeclampsia early is very important. If not treated early, it can cause serious problems for you and your baby. One problem it can lead to is eclampsia. Eclampsia is a condition that causes muscle jerking or shaking (convulsions or seizures) and other serious problems for the mother. During pregnancy, delivering your baby may be the best treatment for preeclampsia or eclampsia. For most women, preeclampsia and eclampsia symptoms go away after giving birth.  In rare cases, a woman may develop preeclampsia after giving birth (postpartum preeclampsia). This usually occurs within 48 hours after childbirth but may occur up to 6 weeks after giving birth.  What are the causes?  The cause of preeclampsia is not known.  What increases the risk?  The following risk factors make you more likely to develop preeclampsia:   Being pregnant for the first time.   Having had preeclampsia during a past pregnancy.   Having a family history of preeclampsia.   Having high blood pressure.   Being pregnant with more than one baby.   Being 35 or older.   Being African-American.   Having kidney disease or diabetes.   Having medical conditions such as lupus or blood diseases.   Being very overweight (obese).  What are the signs or symptoms?  The earliest signs of preeclampsia are:   High blood pressure.   Increased protein in your urine. Your health care provider will check for this at every visit before you give birth (prenatal visit).  Other symptoms that may develop as the condition gets worse include:   Severe headaches.   Sudden weight  gain.   Swelling of the hands, face, legs, and feet.   Nausea and vomiting.   Vision problems, such as blurred or double vision.   Numbness in the face, arms, legs, and feet.   Urinating less than usual.   Dizziness.   Slurred speech.   Abdominal pain, especially upper abdominal pain.   Convulsions or seizures.  How is this diagnosed?  There are no screening tests for preeclampsia. Your health care provider will ask you about symptoms and check for signs of preeclampsia during your prenatal visits. You may also have tests that include:   Urine tests.   Blood tests.   Checking your blood pressure.   Monitoring your baby's heart rate.   Ultrasound.  How is this treated?  You and your health care provider will determine the treatment approach that is best for you. Treatment may include:   Having more frequent prenatal exams to check for signs of preeclampsia, if you have an increased risk for preeclampsia.   Medicine to lower your blood pressure.   Staying in the hospital, if your condition is severe. There, treatment will focus on controlling your blood pressure and the amount of fluids in your body (fluid retention).   Taking medicine (magnesium sulfate) to prevent seizures. This may be given as an injection or through an IV.   Taking a low-dose aspirin during your pregnancy.   Delivering your baby early, if your condition gets worse. You may have your labor started with medicine (induced), or you may have a cesarean   delivery.  Follow these instructions at home:  Eating and drinking     Drink enough fluid to keep your urine pale yellow.   Avoid caffeine.  Lifestyle   Do not use any products that contain nicotine or tobacco, such as cigarettes and e-cigarettes. If you need help quitting, ask your health care provider.   Do not use alcohol or drugs.   Avoid stress as much as possible. Rest and get plenty of sleep.  General instructions   Take over-the-counter and prescription medicines only as  told by your health care provider.   When lying down, lie on your left side. This keeps pressure off your major blood vessels.   When sitting or lying down, raise (elevate) your feet. Try putting some pillows underneath your lower legs.   Exercise regularly. Ask your health care provider what kinds of exercise are best for you.   Keep all follow-up and prenatal visits as told by your health care provider. This is important.  How is this prevented?  There is no known way of preventing preeclampsia or eclampsia from developing. However, to lower your risk of complications and detect problems early:   Get regular prenatal care. Your health care provider may be able to diagnose and treat the condition early.   Maintain a healthy weight. Ask your health care provider for help managing weight gain during pregnancy.   Work with your health care provider to manage any long-term (chronic) health conditions you have, such as diabetes or kidney problems.   You may have tests of your blood pressure and kidney function after giving birth.   Your health care provider may have you take low-dose aspirin during your next pregnancy.  Contact a health care provider if:   You have symptoms that your health care provider told you may require more treatment or monitoring, such as:  ? Headaches.  ? Nausea or vomiting.  ? Abdominal pain.  ? Dizziness.  ? Light-headedness.  Get help right away if:   You have severe:  ? Abdominal pain.  ? Headaches that do not get better.  ? Dizziness.  ? Vision problems.  ? Confusion.  ? Nausea or vomiting.   You have any of the following:  ? A seizure.  ? Sudden, rapid weight gain.  ? Sudden swelling in your hands, ankles, or face.  ? Trouble moving any part of your body.  ? Numbness in any part of your body.  ? Trouble speaking.  ? Abnormal bleeding.   You faint.  Summary   Preeclampsia is a serious condition that may develop during pregnancy. It is also called toxemia of pregnancy.   This  condition causes high blood pressure along with other symptoms, such as swelling and headaches.   Diagnosing and treating preeclampsia early is very important. If not treated early, it can cause serious problems for you and your baby.   Get help right away if you have symptoms that your health care provider told you to watch for.  This information is not intended to replace advice given to you by your health care provider. Make sure you discuss any questions you have with your health care provider.  Document Released: 12/04/2000 Document Revised: 11/23/2017 Document Reviewed: 07/13/2016  Elsevier Interactive Patient Education  2019 Elsevier Inc[employer]

## 2018-12-29 ENCOUNTER — Ambulatory Visit (INDEPENDENT_AMBULATORY_CARE_PROVIDER_SITE_OTHER): Payer: BC Managed Care – PPO | Admitting: Obstetrics & Gynecology

## 2018-12-29 ENCOUNTER — Other Ambulatory Visit (HOSPITAL_COMMUNITY)
Admission: RE | Admit: 2018-12-29 | Discharge: 2018-12-29 | Disposition: A | Discharge status: Home / Self Care | Payer: BC Managed Care – PPO | Source: Ambulatory Visit | Attending: Obstetrics & Gynecology | Admitting: Obstetrics & Gynecology

## 2018-12-29 VITALS — BP 117/78 | HR 105 | Wt >= 6400 oz

## 2018-12-29 DIAGNOSIS — Z3A36 36 weeks gestation of pregnancy: Secondary | ICD-10-CM

## 2018-12-29 DIAGNOSIS — O0993 Supervision of high risk pregnancy, unspecified, third trimester: Secondary | ICD-10-CM

## 2018-12-29 DIAGNOSIS — O133 Gestational [pregnancy-induced] hypertension without significant proteinuria, third trimester: Secondary | ICD-10-CM

## 2018-12-29 DIAGNOSIS — O0990 Supervision of high risk pregnancy, unspecified, unspecified trimester: Secondary | ICD-10-CM | POA: Insufficient documentation

## 2018-12-29 LAB — OB RESULTS CONSOLE GC/CHLAMYDIA: Gonorrhea: NEGATIVE

## 2018-12-29 NOTE — Patient Instructions (Signed)
Labor Induction    Labor induction is when steps are taken to cause a pregnant woman to begin the labor process. Most women go into labor on their own between 37 weeks and 42 weeks of pregnancy. When this does not happen or when there is a medical need for labor to begin, steps may be taken to induce labor. Labor induction causes a pregnant woman's uterus to contract. It also causes the cervix to soften (ripen), open (dilate), and thin out (efface). Usually, labor is not induced before 39 weeks of pregnancy unless there is a medical reason to do so. Your health care provider will determine if labor induction is needed.  Before inducing labor, your health care provider will consider a number of factors, including:  · Your medical condition and your baby's.  · How many weeks along you are in your pregnancy.  · How mature your baby's lungs are.  · The condition of your cervix.  · The position of your baby.  · The size of your birth canal.  What are some reasons for labor induction?  Labor may be induced if:  · Your health or your baby's health is at risk.  · Your pregnancy is overdue by 1 week or more.  · Your water breaks but labor does not start on its own.  · There is a low amount of amniotic fluid around your baby.  You may also choose (elect) to have labor induced at a certain time. Generally, elective labor induction is done no earlier than 39 weeks of pregnancy.  What methods are used for labor induction?  Methods used for labor induction include:  · Prostaglandin medicine. This medicine starts contractions and causes the cervix to dilate and ripen. It can be taken by mouth (orally) or by being inserted into the vagina (suppository).  · Inserting a small, thin tube (catheter) with a balloon into the vagina and then expanding the balloon with water to dilate the cervix.  · Stripping the membranes. In this method, your health care provider gently separates amniotic sac tissue from the cervix. This causes the  cervix to stretch, which in turn causes the release of a hormone called progesterone. The hormone causes the uterus to contract. This procedure is often done during an office visit, after which you will be sent home to wait for contractions to begin.  · Breaking the water. In this method, your health care provider uses a small instrument to make a small hole in the amniotic sac. This eventually causes the amniotic sac to break. Contractions should begin after a few hours.  · Medicine to trigger or strengthen contractions. This medicine is given through an IV that is inserted into a vein in your arm.  Except for membrane stripping, which can be done in a clinic, labor induction is done in the hospital so that you and your baby can be carefully monitored.  How long does it take for labor to be induced?  The length of time it takes to induce labor depends on how ready your body is for labor. Some inductions can take up to 2-3 days, while others may take less than a day. Induction may take longer if:  · You are induced early in your pregnancy.  · It is your first pregnancy.  · Your cervix is not ready.  What are some risks associated with labor induction?  Some risks associated with labor induction include:  · Changes in fetal heart rate, such as being too   high, too low, or irregular (erratic).  · Failed induction.  · Infection in the mother or the baby.  · Increased risk of having a cesarean delivery.  · Fetal death.  · Breaking off (abruption) of the placenta from the uterus (rare).  · Rupture of the uterus (very rare).  When induction is needed for medical reasons, the benefits of induction generally outweigh the risks.  What are some reasons for not inducing labor?  Labor induction should not be done if:  · Your baby does not tolerate contractions.  · You have had previous surgeries on your uterus, such as a myomectomy, removal of fibroids, or a vertical scar from a previous cesarean delivery.  · Your placenta lies  very low in your uterus and blocks the opening of the cervix (placenta previa).  · Your baby is not in a head-down position.  · The umbilical cord drops down into the birth canal in front of the baby.  · There are unusual circumstances, such as the baby being very early (premature).  · You have had more than 2 previous cesarean deliveries.  Summary  · Labor induction is when steps are taken to cause a pregnant woman to begin the labor process.  · Labor induction causes a pregnant woman's uterus to contract. It also causes the cervix to ripen, dilate, and efface.  · Labor is not induced before 39 weeks of pregnancy unless there is a medical reason to do so.  · When induction is needed for medical reasons, the benefits of induction generally outweigh the risks.  This information is not intended to replace advice given to you by your health care provider. Make sure you discuss any questions you have with your health care provider.  Document Released: 04/28/2007 Document Revised: 01/20/2017 Document Reviewed: 01/20/2017  Elsevier Interactive Patient Education © 2019 [employer]

## 2018-12-29 NOTE — Progress Notes (Signed)
Patient reports fetal movement, denies pain today. 

## 2018-12-29 NOTE — Progress Notes (Signed)
   PRENATAL VISIT NOTE  Subjective:  Kelly Lam is a 23 y.o. G1P0 at [redacted]w[redacted]d being seen today for ongoing prenatal care.  She is currently monitored for the following issues for this high-risk pregnancy and has Supervision of high risk pregnancy, antepartum; Morbid obesity (HCC); Genetic carrier; and Gestational hypertension on their problem list.  Patient reports occasional contractions.  Contractions: Irritability. Vag. Bleeding: None.  Movement: Present. Denies leaking of fluid.   The following portions of the patient's history were reviewed and updated as appropriate: allergies, current medications, past family history, past medical history, past social history, past surgical history and problem list. Problem list updated.  Objective:   Vitals:   12/29/18 1538  BP: 117/78  Pulse: (!) 105  Weight: (!) 406 lb 6.4 oz (184.3 kg)    Fetal Status: Fetal Heart Rate (bpm): 163   Movement: Present     General:  Alert, oriented and cooperative. Patient is in no acute distress.  Skin: Skin is warm and dry. No rash noted.   Cardiovascular: Normal heart rate noted  Respiratory: Normal respiratory effort, no problems with respiration noted  Abdomen: Soft, gravid, appropriate for gestational age.  Pain/Pressure: Present     Pelvic: Cervical exam performed        Extremities: Normal range of motion.  Edema: Trace  Mental Status: Normal mood and affect. Normal behavior. Normal judgment and thought content.   Assessment and Plan:  Pregnancy: G1P0 at [redacted]w[redacted]d  1. Supervision of high risk pregnancy, antepartum Routine testing - Strep Gp B NAA - Cervicovaginal ancillary only( [place])  2. Gestational hypertension, third trimester IOL 37 weeks  Preterm labor symptoms and general obstetric precautions including but not limited to vaginal bleeding, contractions, leaking of fluid and fetal movement were reviewed in detail with the patient. Please refer to After Visit Summary for other  counseling recommendations.  Return in about 4 weeks (around 01/26/2019) for postpartum.  Future Appointments  Date Time Provider Department Center  12/30/2018  2:30 PM [facility] [place] 1 [facility]-M[facility]US M[facility]-US  01/01/2019  8:00 AM WH-BSS[facility]ED ROOM [facility]ED None    Scheryl Darter, MD

## 2018-12-30 ENCOUNTER — Encounter (HOSPITAL_COMMUNITY): Payer: Self-pay

## 2018-12-30 ENCOUNTER — Other Ambulatory Visit: Payer: Self-pay | Admitting: Obstetrics & Gynecology

## 2018-12-30 ENCOUNTER — Ambulatory Visit (HOSPITAL_COMMUNITY)
Admission: RE | Admit: 2018-12-30 | Discharge: 2018-12-30 | Disposition: A | Discharge status: Home / Self Care | Payer: BC Managed Care – PPO | Source: Ambulatory Visit | Attending: Obstetrics & Gynecology | Admitting: Obstetrics & Gynecology

## 2018-12-30 DIAGNOSIS — Z3A36 36 weeks gestation of pregnancy: Secondary | ICD-10-CM | POA: Diagnosis not present

## 2018-12-30 DIAGNOSIS — O99213 Obesity complicating pregnancy, third trimester: Secondary | ICD-10-CM | POA: Diagnosis not present

## 2018-12-30 DIAGNOSIS — O0990 Supervision of high risk pregnancy, unspecified, unspecified trimester: Secondary | ICD-10-CM | POA: Diagnosis present

## 2018-12-30 DIAGNOSIS — O133 Gestational [pregnancy-induced] hypertension without significant proteinuria, third trimester: Secondary | ICD-10-CM | POA: Diagnosis present

## 2018-12-30 DIAGNOSIS — O10919 Unspecified pre-existing hypertension complicating pregnancy, unspecified trimester: Secondary | ICD-10-CM

## 2018-12-30 LAB — CERVICOVAGINAL ANCILLARY ONLY
Chlamydia: NEGATIVE
Neisseria Gonorrhea: NEGATIVE

## 2018-12-31 LAB — STREP GP B NAA: Strep Gp B NAA: POSITIVE — AB

## 2019-01-01 ENCOUNTER — Inpatient Hospital Stay (HOSPITAL_COMMUNITY)
Admission: RE | Admit: 2019-01-01 | Discharge: 2019-01-05 | DRG: 807 | Disposition: A | Discharge status: Home / Self Care | Payer: BC Managed Care – PPO | Attending: Family Medicine | Admitting: Family Medicine

## 2019-01-01 ENCOUNTER — Encounter (HOSPITAL_COMMUNITY): Payer: Self-pay

## 2019-01-01 ENCOUNTER — Other Ambulatory Visit: Payer: Self-pay

## 2019-01-01 DIAGNOSIS — O99214 Obesity complicating childbirth: Secondary | ICD-10-CM | POA: Diagnosis present

## 2019-01-01 DIAGNOSIS — O99824 Streptococcus B carrier state complicating childbirth: Secondary | ICD-10-CM | POA: Diagnosis present

## 2019-01-01 DIAGNOSIS — O133 Gestational [pregnancy-induced] hypertension without significant proteinuria, third trimester: Secondary | ICD-10-CM

## 2019-01-01 DIAGNOSIS — O0990 Supervision of high risk pregnancy, unspecified, unspecified trimester: Secondary | ICD-10-CM

## 2019-01-01 DIAGNOSIS — Z3A37 37 weeks gestation of pregnancy: Secondary | ICD-10-CM | POA: Diagnosis not present

## 2019-01-01 DIAGNOSIS — O134 Gestational [pregnancy-induced] hypertension without significant proteinuria, complicating childbirth: Secondary | ICD-10-CM | POA: Diagnosis present

## 2019-01-01 DIAGNOSIS — Z3490 Encounter for supervision of normal pregnancy, unspecified, unspecified trimester: Secondary | ICD-10-CM

## 2019-01-01 DIAGNOSIS — O9973 Diseases of the skin and subcutaneous tissue complicating the puerperium: Secondary | ICD-10-CM | POA: Diagnosis not present

## 2019-01-01 DIAGNOSIS — Z3483 Encounter for supervision of other normal pregnancy, third trimester: Secondary | ICD-10-CM | POA: Diagnosis present

## 2019-01-01 DIAGNOSIS — L89156 Pressure-induced deep tissue damage of sacral region: Secondary | ICD-10-CM | POA: Diagnosis not present

## 2019-01-01 DIAGNOSIS — O139 Gestational [pregnancy-induced] hypertension without significant proteinuria, unspecified trimester: Secondary | ICD-10-CM | POA: Diagnosis present

## 2019-01-01 LAB — COMPREHENSIVE METABOLIC PANEL
ALT: 31 U/L (ref 0–44)
AST: 49 U/L — ABNORMAL HIGH (ref 15–41)
Albumin: 3.2 g/dL — ABNORMAL LOW (ref 3.5–5.0)
Alkaline Phosphatase: 83 U/L (ref 38–126)
Anion gap: 9 (ref 5–15)
BUN: 7 mg/dL (ref 6–20)
CO2: 20 mmol/L — ABNORMAL LOW (ref 22–32)
Calcium: 8.6 mg/dL — ABNORMAL LOW (ref 8.9–10.3)
Chloride: 106 mmol/L (ref 98–111)
Creatinine, Ser: 0.38 mg/dL — ABNORMAL LOW (ref 0.44–1.00)
GFR calc non Af Amer: >60 mL/min (ref >60)
Glucose, Bld: 103 mg/dL — ABNORMAL HIGH (ref 70–99)
Potassium: 4.9 mmol/L (ref 3.5–5.1)
Sodium: 135 mmol/L (ref 135–145)
Total Bilirubin: 1.7 mg/dL — ABNORMAL HIGH (ref 0.3–1.2)
Total Protein: 6.3 g/dL — ABNORMAL LOW (ref 6.5–8.1)

## 2019-01-01 LAB — CBC
HCT: 39.1 % (ref 36.0–46.0)
Hemoglobin: 12.9 g/dL (ref 12.0–15.0)
MCH: 29.9 pg (ref 26.0–34.0)
MCHC: 33 g/dL (ref 30.0–36.0)
MCV: 90.5 fL (ref 80.0–100.0)
Platelets: 226 10*3/uL (ref 150–400)
RBC: 4.32 MIL/uL (ref 3.87–5.11)
RDW: 13.7 % (ref 11.5–15.5)
WBC: 7.6 10*3/uL (ref 4.0–10.5)
nRBC: 0 % (ref 0.0–0.2)

## 2019-01-01 LAB — PROTEIN / CREATININE RATIO, URINE
Creatinine, Urine: 97 mg/dL
Protein Creatinine Ratio: 0.1 mg/mg{Cre} (ref 0.00–0.15)
Total Protein, Urine: 10 mg/dL

## 2019-01-01 LAB — TYPE AND SCREEN
ABO/RH(D): A POS
ANTIBODY SCREEN: NEGATIVE

## 2019-01-01 LAB — OB RESULTS CONSOLE RUBELLA ANTIBODY, IGM: RUBELLA: IMMUNE

## 2019-01-01 LAB — OB RESULTS CONSOLE HEPATITIS B SURFACE ANTIGEN: Hepatitis B Surface Ag: NEGATIVE

## 2019-01-01 LAB — ABO/RH: ABO/RH(D): A POS

## 2019-01-01 MED ORDER — PENICILLIN G 3 MILLION UNITS IVPB - SIMPLE MED
3.0000 10*6.[IU] | INTRAVENOUS | Status: DC
  Administered 2019-01-01 – 2019-01-03 (×8): 3 10*6.[IU] via INTRAVENOUS
  Filled 2019-01-01 (×7): qty 100

## 2019-01-01 MED ORDER — SOD CITRATE-CITRIC ACID 500-334 MG/5ML PO SOLN: 30.0000 mL | ORAL | Status: DC | PRN

## 2019-01-01 MED ORDER — ONDANSETRON HCL 4 MG/2ML IJ SOLN: 4.0000 mg | Freq: Four times a day (QID) | INTRAMUSCULAR | Status: DC | PRN

## 2019-01-01 MED ORDER — LACTATED RINGERS IV SOLN
500.0000 mL | INTRAVENOUS | Status: DC | PRN
  Administered 2019-01-02: 500 mL via INTRAVENOUS

## 2019-01-01 MED ORDER — MISOPROSTOL 25 MCG QUARTER TABLET
25.0000 ug | ORAL_TABLET | ORAL | Status: DC | PRN
  Administered 2019-01-01 (×2): 25 ug via VAGINAL
  Filled 2019-01-01 (×2): qty 1

## 2019-01-01 MED ORDER — OXYTOCIN 40 UNITS IN NORMAL SALINE INFUSION - SIMPLE MED
2.5000 [IU]/h | INTRAVENOUS | Status: DC
  Filled 2019-01-01: qty 1000

## 2019-01-01 MED ORDER — OXYTOCIN BOLUS FROM INFUSION
500.0000 mL | Freq: Once | INTRAVENOUS | Status: AC
  Administered 2019-01-03: 500 mL via INTRAVENOUS

## 2019-01-01 MED ORDER — MISOPROSTOL 50MCG HALF TABLET
50.0000 ug | ORAL_TABLET | ORAL | Status: DC | PRN
  Administered 2019-01-01: 50 ug via BUCCAL
  Filled 2019-01-01 (×2): qty 1

## 2019-01-01 MED ORDER — LIDOCAINE HCL (PF) 1 % IJ SOLN
30.0000 mL | INTRAMUSCULAR | Status: AC | PRN
  Administered 2019-01-03: 30 mL via SUBCUTANEOUS
  Filled 2019-01-01: qty 30

## 2019-01-01 MED ORDER — FLEET ENEMA 7-19 GM/118ML RE ENEM: 1.0000 | ENEMA | RECTAL | Status: DC | PRN

## 2019-01-01 MED ORDER — TERBUTALINE SULFATE 1 MG/ML IJ SOLN: 0.2500 mg | Freq: Once | INTRAMUSCULAR | Status: DC | PRN

## 2019-01-01 MED ORDER — SODIUM CHLORIDE 0.9 % IV SOLN
5.0000 10*6.[IU] | Freq: Once | INTRAVENOUS | Status: AC
  Administered 2019-01-01: 5 10*6.[IU] via INTRAVENOUS
  Filled 2019-01-01: qty 5

## 2019-01-01 MED ORDER — FENTANYL CITRATE (PF) 100 MCG/2ML IJ SOLN
100.0000 ug | INTRAMUSCULAR | Status: DC | PRN
  Administered 2019-01-02: 100 ug via INTRAVENOUS
  Filled 2019-01-01: qty 2

## 2019-01-01 MED ORDER — LACTATED RINGERS IV SOLN
INTRAVENOUS | Status: DC
  Administered 2019-01-01 – 2019-01-03 (×6): via INTRAVENOUS

## 2019-01-01 MED ORDER — ACETAMINOPHEN 325 MG PO TABS
650.0000 mg | ORAL_TABLET | Freq: Four times a day (QID) | ORAL | Status: DC | PRN
  Administered 2019-01-01 – 2019-01-02 (×2): 650 mg via ORAL
  Filled 2019-01-01 (×2): qty 2

## 2019-01-01 NOTE — H&P (Addendum)
LABOR AND DELIVERY ADMISSION HISTORY AND PHYSICAL NOTE  Kelly Lam is a 23 y.o. female G1P0 with IUP at 8271w0d presenting for IOL.  She reports positive fetal movement. She denies leakage of fluid or vaginal bleeding. Denies, headache, dizziness, chest pain, or SOB.  Prenatal History/Complications: [facility] Pregnancy complications:  - Gestational HTN - Morbid obesity, BMI 61.6  Past Medical History: Past Medical History:  Diagnosis Date  . Gestational hypertension 12/01/2018  . Morbid obesity with BMI of 50.0-59.9, adult (HCC)   . Urinary tract infection     Past Surgical History: Past Surgical History:  Procedure Laterality Date  . HERNIA REPAIR    . TONSILLECTOMY    . WISDOM TOOTH EXTRACTION      Obstetrical History: OB History    Gravida  1   Para      Term      Preterm      AB      Living  0     SAB      TAB      Ectopic      Multiple      Live Births              Social History: Social History   Socioeconomic History  . Marital status: Single    Spouse name: Not on file  . Number of children: Not on file  . Years of education: Not on file  . Highest education level: Not on file  Occupational History  . Not on file  Social Needs  . Financial resource strain: Not on file  . Food insecurity:    Worry: Not on file    Inability: Not on file  . Transportation needs:    Medical: Not on file    Non-medical: Not on file  Tobacco Use  . Smoking status: Never Smoker  . Smokeless tobacco: Never Used  Substance and Sexual Activity  . Alcohol use: No  . Drug use: Never  . Sexual activity: Not Currently    Partners: Male    Birth control/protection: None  Lifestyle  . Physical activity:    Days per week: Not on file    Minutes per session: Not on file  . Stress: Not on file  Relationships  . Social connections:    Talks on phone: Not on file    Gets together: Not on file    Attends religious  service: Not on file    Active member of club or organization: Not on file    Attends meetings of clubs or organizations: Not on file    Relationship status: Not on file  Other Topics Concern  . Not on file  Social History Narrative  . Not on file    Family History: Family History  Problem Relation Age of Onset  . Hypertension Father   . Diabetes Maternal Grandfather   . Congestive Heart Failure Maternal Grandfather   . Cancer Maternal Grandfather   . Huntington's disease Paternal Grandfather     Allergies: No Known Allergies  Medications Prior to Admission  Medication Sig Dispense Refill Last Dose  . aspirin 81 MG chewable tablet Chew 81 mg by mouth daily.    12/31/2018 at Unknown time  . Calcium Carbonate-Vit D-Min (CALCIUM 1200 PO) Take 1 tablet by mouth at bedtime.    12/31/2018 at Unknown time  . diphenhydrAMINE-APAP, sleep, (TYLENOL PM EXTRA STRENGTH PO) Take 2 tablets by mouth at bedtime.  12/31/2018 at Unknown time  . Prenatal Vit-Fe Fumarate-FA (MULTIVITAMIN-PRENATAL) 27-0.8 MG TABS tablet Take 1 tablet by mouth daily at 12 noon.   12/31/2018 at Unknown time  . acetaminophen (TYLENOL) 325 MG tablet Take 325 mg by mouth every 6 (six) hours as needed for headache.   12/30/2018     Review of Systems  All systems reviewed and negative except as stated in HPI  Physical Exam Blood pressure 139/66, pulse 98, temperature 98.4 F (36.9 C), temperature source Oral, resp. rate 18, height 5\' 8"  (1.727 m), weight (!) 183.5 kg, last menstrual period 04/17/2018. General appearance: alert, oriented, NAD Lungs: normal respiratory effort, clear bilaterally   Heart: regular rate no audible m/r/g Abdomen: soft, non-tender; gravid, FH appropriate for GA Extremities: mild bilateral calf tenderness to palpation bilaterally, no erythema or swelling. 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: Normal, SMA carrier Maternal Ultrasounds/Referrals: Normal Fetal Ultrasounds or other Referrals:  Other:  Maternal Substance Abuse:  No Significant Maternal Medications:  None Significant Maternal Lab Results: None  Results for orders placed or performed during the hospital encounter of 01/01/19 (from the past 24 hour(s))  CBC   Collection Time: 01/01/19  8:34 AM  Result Value Ref Range   WBC 7.6 4.0 - 10.5 K/uL   RBC 4.32 3.87 - 5.11 MIL/uL   Hemoglobin 12.9 12.0 - 15.0 g/dL   HCT 62.8 36.6 - 29.4 %   MCV 90.5 80.0 - 100.0 fL   MCH 29.9 26.0 - 34.0 pg   MCHC 33.0 30.0 - 36.0 g/dL   RDW 76.5 46.5 - 03.5 %   Platelets 226 150 - 400 K/uL   nRBC 0.0 0.0 - 0.2 %  Comprehensive metabolic panel   Collection Time: 01/01/19  8:34 AM  Result Value Ref Range   Sodium 135 135 - 145 mmol/L   Potassium 4.9 3.5 - 5.1 mmol/L   Chloride 106 98 - 111 mmol/L   CO2 20 (L) 22 - 32 mmol/L   Glucose, Bld 103 (H) 70 - 99 mg/dL   BUN 7 6 - 20 mg/dL   Creatinine, Ser 4.65 (L) 0.44 - 1.00 mg/dL   Calcium 8.6 (L) 8.9 - 10.3 mg/dL   Total Protein 6.3 (L) 6.5 - 8.1 g/dL   Albumin 3.2 (L) 3.5 - 5.0 g/dL   AST 49 (H) 15 - 41 U/L   ALT 31 0 - 44 U/L   Alkaline Phosphatase 83 38 - 126 U/L   Total Bilirubin 1.7 (H) 0.3 - 1.2 mg/dL   GFR calc non Af Amer >60 >60 mL/min   GFR calc Af Amer >60 >60 mL/min   Anion gap 9 5 - 15    Patient Active Problem List   Diagnosis Date Noted  . Encounter for planned induction of labor 01/01/2019   . Gestational hypertension 12/01/2018  . 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 Supervision of high risk pregnancy, antepartum 08/15/2018  . Morbid obesity (HCC) 08/15/2018    Assessment: Kelly Lam is a  23 y.o. G1P0 at 8643w0d here for IOL. 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  #Labor: Induction #Pain: Plans to get epidural #FWB: Category 1 #ID: GBS + #MOF: both breast and bottle #MOC:OCPs #Circ: Yes  Isabel CapriceDeborah J Barnett 01/01/2019, 11:10 AM   I was present for the exam and agree with above. Verified Vtx presentation by informal BS US.  Cytotec for IOL.  Pre-E labs Nml except mildly elevated AST 49. Repeat Pre-E labs in am  [place], [place], [place] 01/01/2019 7:09 PM

## 2019-01-01 NOTE — Anesthesia Pain Management Evaluation Note (Signed)
  [facility] Pain Management Visit Note  Patient: Kelly Lam, 23 y.o., female  "Hello I am a member of the anesthesia team at [facility]. We have an anesthesia team available at all times to provide care throughout the hospital, including epidural management and anesthesia for C-section. I don't know your plan for the delivery whether it a natural birth, water birth, IV sedation, nitrous supplementation, doula or epidural, but we want to meet your pain goals."   1.Was your pain managed to your expectations on prior hospitalizations?   No prior hospitalizations  2.What is your expectation for pain management during this hospitalization?     Epidural, IV pain meds and Nitrous Oxide  3.How can we help you reach that goal? Be available  Record the patient's initial score and the patient's pain goal.   Pain: 2  Pain Goal: 6 [facility] wants you to be able to say your pain was always managed very well.  [facility] 01/01/2019

## 2019-01-01 NOTE — Progress Notes (Addendum)
Labor Progress Note Kelly Lam is a 23 y.o. G1P0 at [redacted]w[redacted]d presented for IOL due to gestational hypertension.  S: Pt states she feels irregular contractions along with some pressure in her back. She does not currently report any pain.   O:  BP 131/60   Pulse 94   Temp 98.4 F (36.9 C) (Oral)   Resp (!) 24   Ht 5\' 8"  (1.727 m)   Wt (!) 183.5 kg   LMP 04/17/2018 (Exact Date)   BMI 61.50 kg/m    CVE: Dilation: Fingertip Effacement (%): Thick Cervical Position: Posterior Station: Ballotable Presentation: Vertex Exam by:: Verdie Drown, RN  Pt is in no distress, resting and visiting with family.  A&P: 23 y.o. G1P0 [redacted]w[redacted]d  #Labor: slow progression, received 3rd dose of Cytotec recently, and will be due for a cervix check with in the next 1-2 hrs #Pain: Plans for epidural #FWB: Category 1 #GBS positive   Isabel Caprice, Student-PA 7:02 PM  I was present for the exam and agree with above.  Katrinka Blazing, [place], CNM 01/02/2019 12:01 AM

## 2019-01-01 NOTE — Progress Notes (Signed)
Kelly Lam is a 23 y.o. G1P0 at [redacted]w[redacted]d for IOL for gHTN  Subjective: Doing well. Very comfortable  Objective: BP (!) 143/83 (BP Location: Right Wrist) Comment (BP Location): forearm  Pulse (!) 106   Temp 98.4 F (36.9 C) (Axillary)   Resp 17   Ht 5\' 8"  (1.727 m)   Wt (!) 183.5 kg   LMP 04/17/2018 (Exact Date)   BMI 61.50 kg/m  No intake/output data recorded. No intake/output data recorded.  FHT:  FHR: 150 bpm, variability: moderate,  accelerations:  Present,  decelerations:  Absent UC:   irregular, every 4-6 minutes, no adequate ctxs SVE:   Dilation: 1 Effacement (%): 50 Station: -3 Exam by:: Dr. Aneta Mins  Labs: Lab Results  Component Value Date   WBC 7.6 01/01/2019   HGB 12.9 01/01/2019   HCT 39.1 01/01/2019   MCV 90.5 01/01/2019   PLT 226 01/01/2019    Assessment / Plan: IOL for gHTN. Progressed in terms of dilation and effacement with cytotec x 2. Discussed possibility of foley bulb and next check.  Labor: Progressing on cytotec. Likely FB at next check Preeclampsia:  BP 143/83 at last check. Will continue to monitor Fetal Wellbeing:  Category I Pain Control:  IV pain meds, epidural when needed I/D:  n/a Anticipated MOD:  NSVD  [name] 01/01/2019, 9:07 PM

## 2019-01-02 ENCOUNTER — Inpatient Hospital Stay (HOSPITAL_COMMUNITY): Payer: BC Managed Care – PPO | Admitting: Anesthesiology

## 2019-01-02 ENCOUNTER — Encounter (HOSPITAL_COMMUNITY): Payer: Self-pay

## 2019-01-02 LAB — COMPREHENSIVE METABOLIC PANEL
ALT: 28 U/L (ref 0–44)
AST: 22 U/L (ref 15–41)
Albumin: 2.9 g/dL — ABNORMAL LOW (ref 3.5–5.0)
Alkaline Phosphatase: 86 U/L (ref 38–126)
Anion gap: 8 (ref 5–15)
BUN: 7 mg/dL (ref 6–20)
CO2: 19 mmol/L — ABNORMAL LOW (ref 22–32)
Calcium: 8.7 mg/dL — ABNORMAL LOW (ref 8.9–10.3)
Chloride: 109 mmol/L (ref 98–111)
Creatinine, Ser: 0.44 mg/dL (ref 0.44–1.00)
GFR calc Af Amer: >60 mL/min (ref >60)
GFR calc non Af Amer: >60 mL/min (ref >60)
Glucose, Bld: 101 mg/dL — ABNORMAL HIGH (ref 70–99)
Potassium: 4 mmol/L (ref 3.5–5.1)
Sodium: 136 mmol/L (ref 135–145)
Total Bilirubin: 0.6 mg/dL (ref 0.3–1.2)
Total Protein: 6 g/dL — ABNORMAL LOW (ref 6.5–8.1)

## 2019-01-02 LAB — CBC
HCT: 37.5 % (ref 36.0–46.0)
Hemoglobin: 12.6 g/dL (ref 12.0–15.0)
MCH: 30.2 pg (ref 26.0–34.0)
MCHC: 33.6 g/dL (ref 30.0–36.0)
MCV: 89.9 fL (ref 80.0–100.0)
PLATELETS: 231 10*3/uL (ref 150–400)
RBC: 4.17 MIL/uL (ref 3.87–5.11)
RDW: 13.8 % (ref 11.5–15.5)
WBC: 13.8 10*3/uL — AB (ref 4.0–10.5)
nRBC: 0 % (ref 0.0–0.2)

## 2019-01-02 LAB — RPR: RPR Ser Ql: NONREACTIVE

## 2019-01-02 MED ORDER — PHENYLEPHRINE 40 MCG/ML (10ML) SYRINGE FOR IV PUSH (FOR BLOOD PRESSURE SUPPORT)
PREFILLED_SYRINGE | INTRAVENOUS | Status: AC
  Filled 2019-01-02: qty 10

## 2019-01-02 MED ORDER — LACTATED RINGERS IV SOLN: 500.0000 mL | Freq: Once | INTRAVENOUS | Status: DC

## 2019-01-02 MED ORDER — OXYTOCIN 40 UNITS IN NORMAL SALINE INFUSION - SIMPLE MED
1.0000 m[IU]/min | INTRAVENOUS | Status: DC
  Administered 2019-01-02: 2 m[IU]/min via INTRAVENOUS
  Administered 2019-01-03: 4 m[IU]/min via INTRAVENOUS

## 2019-01-02 MED ORDER — FENTANYL 2.5 MCG/ML BUPIVACAINE 1/10 % EPIDURAL INFUSION (WH - ANES)
INTRAMUSCULAR | Status: AC
  Filled 2019-01-02: qty 100

## 2019-01-02 MED ORDER — EPHEDRINE 5 MG/ML INJ: 10.0000 mg | INTRAVENOUS | Status: DC | PRN

## 2019-01-02 MED ORDER — DIPHENHYDRAMINE HCL 50 MG/ML IJ SOLN: 12.5000 mg | INTRAMUSCULAR | Status: DC | PRN

## 2019-01-02 MED ORDER — PHENYLEPHRINE 40 MCG/ML (10ML) SYRINGE FOR IV PUSH (FOR BLOOD PRESSURE SUPPORT): 80.0000 ug | PREFILLED_SYRINGE | INTRAVENOUS | Status: DC | PRN

## 2019-01-02 MED ORDER — LIDOCAINE HCL (PF) 1 % IJ SOLN
INTRAMUSCULAR | Status: DC | PRN
  Administered 2019-01-02 (×2): 4 mL via EPIDURAL

## 2019-01-02 MED ORDER — FENTANYL 2.5 MCG/ML BUPIVACAINE 1/10 % EPIDURAL INFUSION (WH - ANES)
14.0000 mL/h | INTRAMUSCULAR | Status: DC | PRN
  Administered 2019-01-02 – 2019-01-03 (×4): 14 mL/h via EPIDURAL
  Filled 2019-01-02 (×4): qty 100

## 2019-01-02 MED ORDER — TERBUTALINE SULFATE 1 MG/ML IJ SOLN: 0.2500 mg | Freq: Once | INTRAMUSCULAR | Status: DC | PRN

## 2019-01-02 MED ORDER — BUTORPHANOL TARTRATE 1 MG/ML IJ SOLN
1.0000 mg | INTRAMUSCULAR | Status: DC | PRN
  Administered 2019-01-02: 1 mg via INTRAVENOUS
  Filled 2019-01-02: qty 1

## 2019-01-02 NOTE — Anesthesia Preprocedure Evaluation (Signed)
Anesthesia Evaluation  Patient identified by MRN, date of birth, ID band Patient awake    Reviewed: Allergy & Precautions, Patient's Chart, lab work & pertinent test results  History of Anesthesia Complications Negative for: history of anesthetic complications  Airway Mallampati: II  TM Distance: >3 FB Neck ROM: Full    Dental  (+) Teeth Intact, Dental Advisory Given   Pulmonary neg pulmonary ROS,    Pulmonary exam normal breath sounds clear to auscultation       Cardiovascular hypertension (gestational), Normal cardiovascular exam Rhythm:Regular Rate:Normal     Neuro/Psych negative neurological ROS  negative psych ROS   GI/Hepatic negative GI ROS, Neg liver ROS,   Endo/Other  Morbid obesity (BMI 61.5)  Renal/GU negative Renal ROS  negative genitourinary   Musculoskeletal negative musculoskeletal ROS (+)   Abdominal   Peds negative pediatric ROS (+)  Hematology negative hematology ROS (+)   Anesthesia Other Findings   Reproductive/Obstetrics (+) Pregnancy                             Anesthesia Physical Anesthesia Plan  ASA: III  Anesthesia Plan: Epidural   Post-op Pain Management:    Induction:   PONV Risk Score and Plan: Treatment may vary due to age or medical condition  Airway Management Planned: Natural Airway  Additional Equipment:   Intra-op Plan:   Post-operative Plan:   Informed Consent: I have reviewed the patients History and Physical, chart, labs and discussed the procedure including the risks, benefits and alternatives for the proposed anesthesia with the patient or authorized representative who has indicated his/her understanding and acceptance.     Plan Discussed with: CRNA  Anesthesia Plan Comments:         Anesthesia Quick Evaluation

## 2019-01-02 NOTE — Progress Notes (Signed)
Labor Progress Note Fallyn E Randol is a 23 y.o. G1P0 at [redacted]w[redacted]d presented for IOL for gHTN  S:  Comfortable with epidural. No c/o.  O:  BP 136/66   Pulse (!) 104   Temp 98.5 F (36.9 C) (Oral)   Resp 20   Ht 5\' 8"  (1.727 m)   Wt (!) 183.5 kg   LMP 04/17/2018 (Exact Date)   SpO2 97%   BMI 61.50 kg/m  EFM: baseline 145 bpm/ mod variability/ + accels/ no decels  Toco: irregular SVE: Dilation: 5 Effacement (%): 50 Cervical Position: Anterior Station: -3 Presentation: Vertex Exam by:: Abbigayle Toole,CNM  A/P: 23 y.o. G1P0 [redacted]w[redacted]d  1. Labor: latent 2. FWB: Cat I 3. Pain: epidural 4. gHTN- stable  Foley out. Will start Pitocin. Anticipate labor progression and SVD.  Donette Larry, CNM 9:57 AM

## 2019-01-02 NOTE — Progress Notes (Signed)
LABOR PROGRESS NOTE  Teruko E Gerstenberger is a 23 y.o. G1P0 at [redacted]w[redacted]d  admitted for IOL for gHTN. PEC labs nml.   Subjective: Doing well, resting comfortably with epidural, c/o increased pressure  Objective: BP (!) 152/81   Pulse (!) 105   Temp 98.1 F (36.7 C) (Oral)   Resp 18   Ht 5\' 8"  (1.727 m)   Wt (!) 183.5 kg   LMP 04/17/2018 (Exact Date)   SpO2 97%   BMI 61.50 kg/m  or  Vitals:   01/02/19 2000 01/02/19 2030 01/02/19 2100 01/02/19 2130  BP: 129/70 (!) 148/80 (!) 158/86 (!) 152/81  Pulse: (!) 117 (!) 106 (!) 104 (!) 105  Resp: 18 18 18 18   Temp:      TempSrc:      SpO2:      Weight:      Height:       1855 Dilation: 5 Effacement (%): 70 Cervical Position: Anterior Station: 0 Presentation: Vertex Exam by:: lee FSE: baseline rate 135, moderate varibility, +accel, no decel IUPC: every 2-4 minutes lasting for 80-110 minutes, MVU's 150-160s   Labs: Lab Results  Component Value Date   WBC 13.8 (H) 01/02/2019   HGB 12.6 01/02/2019   HCT 37.5 01/02/2019   MCV 89.9 01/02/2019   PLT 231 01/02/2019    Patient Active Problem List   Diagnosis Date Noted  . Encounter for planned induction of labor 01/01/2019  . Gestational hypertension 12/01/2018  . 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 Supervision of high risk pregnancy, antepartum 08/15/2018  . Morbid obesity (HCC) 08/15/2018    Assessment / Plan: 23 y.o. G1P0 at [redacted]w[redacted]d here for IOL for gHTN.  GHTN: continue to monitor BP's, PEC labs nml, 144/82-153/97  GBS+, continue PCN  Labor: s/p foley, cytotec X3, slow progress. No cervical dilation since this am. IUPC/FSE placed at 1900. Continue to titrate pitocin for adequate MVU's as long as fetus tolerates Fetal Wellbeing:  Category 1 Pain Control:  epidural Anticipated MOD:  SVD  Raine Blodgett, SNM  01/02/2019, 10:06 PM

## 2019-01-02 NOTE — Anesthesia Procedure Notes (Signed)
Epidural Patient location during procedure: OB Start time: 01/02/2019 4:19 AM End time: 01/02/2019 4:24 AM  Staffing Anesthesiologist: Kaylyn Layer, MD Performed: anesthesiologist   Preanesthetic Checklist Completed: patient identified, pre-op evaluation, timeout performed, IV checked, risks and benefits discussed and monitors and equipment checked  Epidural Patient position: sitting Prep: site prepped and draped and DuraPrep Patient monitoring: continuous pulse ox, blood pressure, heart rate and cardiac monitor Approach: midline Location: L3-L4 Injection technique: LOR air  Needle:  Needle type: Tuohy  Needle gauge: 17 G Needle length: 9 cm Needle insertion depth: 10 cm Catheter type: closed end flexible Catheter size: 19 Gauge Catheter at skin depth: 15 cm Test dose: negative and Other (1% lidocaine)  Assessment Events: blood not aspirated, injection not painful, no injection resistance, negative IV test and no paresthesia  Additional Notes Patient identified. Risks, benefits, and alternatives discussed with patient including but not limited to bleeding, infection, nerve damage, paralysis, failed block, incomplete pain control, headache, blood pressure changes, nausea, vomiting, reactions to medication, itching, and postpartum back pain. Confirmed with bedside nurse the patient's most recent platelet count. Confirmed with patient that they are not currently taking any anticoagulation, have any bleeding history, or any family history of bleeding disorders. Patient expressed understanding and wished to proceed. All questions were answered. Sterile technique was used throughout the entire procedure. Unable to palpate spinous processes due to body habitus. 2 attempts with several needle redirections required to access epidural space. Crisp LOR with 9cm needle firmly pressed into subcutaneous tissue. Catheter threaded easily to 15cm. Please see nursing notes for vital signs. Test  dose was given through epidural catheter and negative prior to continuing to dose epidural or start infusion. Warning signs of high block given to the patient including shortness of breath, tingling/numbness in hands, complete motor block, or any concerning symptoms with instructions to call for help. Patient was given instructions on fall risk and not to get out of bed. All questions and concerns addressed with instructions to call with any issues or inadequate analgesia. Pt more comfortable with contractions prior to my leaving room.Reason for block:procedure for pain

## 2019-01-02 NOTE — Progress Notes (Signed)
Vitals:   01/02/19 2100 01/02/19 2130  BP: (!) 158/86 (!) 152/81  Pulse: (!) 104 (!) 105  Resp: 18 18  Temp:    SpO2:     Comfortable w/epidural.  MVUs 180-149mmHg. Cx 5-6/80/-2.  FHR Cat 1.  Pitocin at 54mu/min.  Continue present mgt until labor adequate.

## 2019-01-02 NOTE — Progress Notes (Signed)
Labor Progress Note Kelly Lam is a 23 y.o. G1P0 at [redacted]w[redacted]d presented for IOL for gHTN  S:  Comfortable with epidural. No c/o.  O:  BP 132/68   Pulse (!) 105   Temp 98.5 F (36.9 C) (Oral)   Resp 18   Ht 5\' 8"  (1.727 m)   Wt (!) 183.5 kg   LMP 04/17/2018 (Exact Date)   SpO2 97%   BMI 61.50 kg/m  EFM: baseline 145 bpm/ mod variability/ + accels/ no decels  Toco: 2-4 SVE: deferred Pitocin: 12 mu/min  A/P: 23 y.o. G1P0 [redacted]w[redacted]d  1. Labor: latent 2. FWB: Cat I 3. Pain: epidural 4. gHTN- stable  Continue Pitocin. Anticipate labor progression and SVD.  Donette Larry, CNM 2:09 PM

## 2019-01-02 NOTE — Progress Notes (Signed)
Kelly Lam is a 23 y.o. G1P0 at [redacted]w[redacted]d for IOL for gHTN  Subjective: Pain well controlled. Some discomfort with placing foley bulb.  Objective: BP (!) 143/84   Pulse (!) 102   Temp 97.7 F (36.5 C) (Oral)   Resp 18   Ht 5\' 8"  (1.727 m)   Wt (!) 183.5 kg   LMP 04/17/2018 (Exact Date)   BMI 61.50 kg/m  No intake/output data recorded. No intake/output data recorded.  FHT:  FHR: 135 bpm, variability: moderate,  accelerations:  Present,  decelerations:  Absent UC:   irregular, every 6-10 minutes, none adequete SVE:   Dilation: 1 Effacement (%): 50 Station: -3 Exam by:: Dr. Aneta Lam  Labs: Lab Results  Component Value Date   WBC 7.6 01/01/2019   HGB 12.9 01/01/2019   HCT 39.1 01/01/2019   MCV 90.5 01/01/2019   PLT 226 01/01/2019    Assessment / Plan: Minimal progress in terms of dilation. Foley bulb placed.  Labor: very minor progress with cytotec. Foley bulb placed. Preeclampsia:  n/a Fetal Wellbeing:  Category I Pain Control:  IV pain meds I/D:  n/a Anticipated MOD:  NSVD  Kelly Lam 01/02/2019, 1:29 AM

## 2019-01-02 NOTE — Progress Notes (Signed)
Labor Progress Note Kelly Lam is a 23 y.o. G1P0 at 8738w1d presented for IOL for gHTN Called to bedside for FHR decel  S:  Comfortable with epidural.   O:  BP (!) 151/59   Pulse (!) 107   Temp 98.5 F (36.9 C) (Oral)   Resp 20   Ht 5\' 8"  (1.727 m)   Wt (!) 183.5 kg   LMP 04/17/2018 (Exact Date)   SpO2 97%   BMI 61.50 kg/m  EFM: baseline 150 bpm/ mod variability/ no accels/ prolonged decels  Toco: 2-3 SVE: Dilation: 5 Effacement (%): 70 Cervical Position: Anterior Station: -2 Presentation: Vertex Exam by:: Tayte Mcwherter,CNM Pitocin: 16 mu/min AROM- clear  A/P: 23 y.o. G1P0 3738w1d  1. Labor: latent 2. FWB: Cat II 3. Pain: epidural  Prolonged FHR decel into 90s x5-6 min, now recovered with 150 baseline. IUPC and FSE placed. Pitocin off to allow fetal recovery. Anticipate labor progress and SVD.  Donette LarryMelanie Dodd Schmid, CNM 6:43 PM

## 2019-01-03 ENCOUNTER — Encounter (HOSPITAL_COMMUNITY): Payer: Self-pay

## 2019-01-03 DIAGNOSIS — O134 Gestational [pregnancy-induced] hypertension without significant proteinuria, complicating childbirth: Secondary | ICD-10-CM

## 2019-01-03 DIAGNOSIS — O99824 Streptococcus B carrier state complicating childbirth: Secondary | ICD-10-CM

## 2019-01-03 DIAGNOSIS — Z3A37 37 weeks gestation of pregnancy: Secondary | ICD-10-CM

## 2019-01-03 MED ORDER — ACETAMINOPHEN 325 MG PO TABS
650.0000 mg | ORAL_TABLET | ORAL | Status: DC | PRN
  Administered 2019-01-03 – 2019-01-05 (×5): 650 mg via ORAL
  Filled 2019-01-03 (×5): qty 2

## 2019-01-03 MED ORDER — SIMETHICONE 80 MG PO CHEW: 80.0000 mg | CHEWABLE_TABLET | ORAL | Status: DC | PRN

## 2019-01-03 MED ORDER — ONDANSETRON HCL 4 MG PO TABS
4.0000 mg | ORAL_TABLET | ORAL | Status: DC | PRN
  Filled 2019-01-03: qty 1

## 2019-01-03 MED ORDER — ONDANSETRON HCL 4 MG/2ML IJ SOLN: 4.0000 mg | INTRAMUSCULAR | Status: DC | PRN

## 2019-01-03 MED ORDER — COCONUT OIL OIL: 1.0000 "application " | TOPICAL_OIL | Status: DC | PRN

## 2019-01-03 MED ORDER — PRENATAL MULTIVITAMIN CH
1.0000 | ORAL_TABLET | Freq: Every day | ORAL | Status: DC
  Administered 2019-01-04 – 2019-01-05 (×2): 1 via ORAL
  Filled 2019-01-03 (×2): qty 1

## 2019-01-03 MED ORDER — TETANUS-DIPHTH-ACELL PERTUSSIS 5-2.5-18.5 LF-MCG/0.5 IM SUSP: 0.5000 mL | Freq: Once | INTRAMUSCULAR | Status: DC

## 2019-01-03 MED ORDER — SENNOSIDES-DOCUSATE SODIUM 8.6-50 MG PO TABS
2.0000 | ORAL_TABLET | ORAL | Status: DC
  Administered 2019-01-03 – 2019-01-05 (×2): 2 via ORAL
  Filled 2019-01-03: qty 2

## 2019-01-03 MED ORDER — IBUPROFEN 600 MG PO TABS
600.0000 mg | ORAL_TABLET | Freq: Four times a day (QID) | ORAL | Status: DC
  Administered 2019-01-03 – 2019-01-05 (×9): 600 mg via ORAL
  Filled 2019-01-03 (×8): qty 1

## 2019-01-03 MED ORDER — DIPHENHYDRAMINE HCL 25 MG PO CAPS: 25.0000 mg | ORAL_CAPSULE | Freq: Four times a day (QID) | ORAL | Status: DC | PRN

## 2019-01-03 MED ORDER — OXYCODONE HCL 5 MG PO TABS
5.0000 mg | ORAL_TABLET | ORAL | Status: DC | PRN
  Administered 2019-01-03 – 2019-01-05 (×5): 5 mg via ORAL
  Filled 2019-01-03 (×5): qty 1

## 2019-01-03 MED ORDER — ZOLPIDEM TARTRATE 5 MG PO TABS: 5.0000 mg | ORAL_TABLET | Freq: Every evening | ORAL | Status: DC | PRN

## 2019-01-03 MED ORDER — WITCH HAZEL-GLYCERIN EX PADS: 1.0000 "application " | MEDICATED_PAD | CUTANEOUS | Status: DC | PRN

## 2019-01-03 MED ORDER — DIBUCAINE 1 % RE OINT: 1.0000 "application " | TOPICAL_OINTMENT | RECTAL | Status: DC | PRN

## 2019-01-03 MED ORDER — BENZOCAINE-MENTHOL 20-0.5 % EX AERO
1.0000 "application " | INHALATION_SPRAY | CUTANEOUS | Status: DC | PRN
  Administered 2019-01-03 (×2): 1 via TOPICAL
  Filled 2019-01-03 (×2): qty 56

## 2019-01-03 MED ORDER — BARRIER CREAM NON-SPECIFIED
1.0000 "application " | TOPICAL_CREAM | Freq: Two times a day (BID) | TOPICAL | Status: DC | PRN
  Administered 2019-01-04 – 2019-01-05 (×3): 1 via TOPICAL
  Filled 2019-01-03: qty 1

## 2019-01-03 NOTE — Anesthesia Postprocedure Evaluation (Signed)
Anesthesia Post Note  Patient: Kelly Lam  Procedure(s) Performed: AN AD HOC LABOR EPIDURAL     Patient location during evaluation: Mother Baby Anesthesia Type: Epidural Level of consciousness: awake, awake and alert and oriented Pain management: pain level controlled Vital Signs Assessment: post-procedure vital signs reviewed and stable Respiratory status: spontaneous breathing, nonlabored ventilation and respiratory function stable Cardiovascular status: stable Postop Assessment: no headache, no backache, adequate PO intake, able to ambulate, no apparent nausea or vomiting and patient able to bend at knees Anesthetic complications: no    Last Vitals:  Vitals:   01/03/19 1010 01/03/19 1126  BP: 121/65 121/69  Pulse: (!) 104 97  Resp: 15 18  Temp: 37.3 C 37.2 C  SpO2:      Last Pain:  Vitals:   01/03/19 1613  TempSrc:   PainSc: (P) 7    Pain Goal:                @ANFLOW60MIN (59935)  )Abril Cappiello

## 2019-01-03 NOTE — Progress Notes (Signed)
LABOR PROGRESS NOTE  Shalene E Ganger is a 23 y.o. G1P0 at [redacted]w[redacted]d  admitted for IOL for gHTN  Subjective: Feeling increased pressure, coping ok. Deep breathing with contractions  Objective: BP 134/86   Pulse (!) 105   Temp 98.1 F (36.7 C) (Oral)   Resp 18   Ht 5\' 8"  (1.727 m)   Wt (!) 183.5 kg   LMP 04/17/2018 (Exact Date)   SpO2 97%   BMI 61.50 kg/m  or  Vitals:   01/03/19 0232 01/03/19 0302 01/03/19 0332 01/03/19 0402  BP: (!) 141/75 139/77 136/66 134/86  Pulse: (!) 102 95 (!) 102 (!) 105  Resp: 18 20 20 18   Temp:      TempSrc:      SpO2:      Weight:      Height:        0445 SVE Dilation: 7.5 Effacement (%): 80 Cervical Position: Anterior Station: Plus 2 Presentation: Vertex Exam by:: Marius Ditch, RN FSE: baseline rate 135, moderate varibility, no acel, no decel IUPC: MVU's 60, every 3-4 lasting 40-60 sec.  Labs: Lab Results  Component Value Date   WBC 13.8 (H) 01/02/2019   HGB 12.6 01/02/2019   HCT 37.5 01/02/2019   MCV 89.9 01/02/2019   PLT 231 01/02/2019    Patient Active Problem List   Diagnosis Date Noted  . Encounter for planned induction of labor 01/01/2019  . Gestational hypertension 12/01/2018  . 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 Supervision of high risk pregnancy, antepartum 08/15/2018  . Morbid obesity (HCC) 08/15/2018    Assessment / Plan: 23 y.o. G1P0 at [redacted]w[redacted]d here for IOL for gHTN  GHTN: no severe bp's, continue to monitor   Fetal Wellbeing:  Called to bedside at 0445 for prolonged decel that resolved with position changes. Pt checked by primary RN and found to be the same cervical dilation (7.5 cm), FSE replaced. Pitocin turned off at this time. Baby recovered and currently category 1 tracing with inadequate MVU's. Restart pitocin at 25mu/min  Labor: active labor , slowly progressing. Restart pitocin and titrate to adequate MVU's per order Pain Control:  epidural Anticipated MOD:  SVD  Chinedum Vanhouten, SNM  01/03/2019, 5:41 AM

## 2019-01-03 NOTE — Progress Notes (Signed)
Kelly Pea22yo G1PO at [phone] admitted for IOL for gHTN.   S: feeling increased pressure, requesting to start pushing   O: BP 138/68   Pulse (!) 111   Temp 98.7 F (37.1 C) (Oral)   Resp 20   Ht 5\' 8"  (1.727 m)   Wt (!) 183.5 kg   LMP 04/17/2018 (Exact Date)   SpO2 100%   BMI 61.50 kg/m    0630 Dilation: 10 Effacement (%): 100 Cervical Position: Anterior Station: Plus 1 Presentation: Vertex Exam by:: Santino Kinsella   A:  22 y.o. G1P0 at 483w2d here for IOL for gHTN  GHTN: no severe bp's, continue to monitor  P: 0640 pt complaining of increased pressure. At +2 station. Requesting to start pushing. After initial first few pushes, prolonged decel noted for 8 minutes. Pt turned, pit turned off and fluid bolus started. Dr. Despina HiddenEure called to bedside.   Contractions irregular and no longer strong. Pt not pushing effectively. Baby recovered. Will begin pitocin and labor down until strength of contraction increases.

## 2019-01-03 NOTE — Anesthesia Postprocedure Evaluation (Signed)
Anesthesia Post Note  Patient: Kelly Lam  Procedure(s) Performed: AN AD HOC LABOR EPIDURAL     Anesthesia Type: Epidural Level of consciousness: awake, awake and alert and oriented Pain management: pain level controlled Vital Signs Assessment: post-procedure vital signs reviewed and stable Respiratory status: spontaneous breathing and respiratory function stable Cardiovascular status: blood pressure returned to baseline Postop Assessment: no headache, no backache, epidural receding, patient able to bend at knees, no apparent nausea or vomiting, adequate PO intake and able to ambulate Anesthetic complications: no    Last Vitals:  Vitals:   01/03/19 1010 01/03/19 1126  BP: 121/65 121/69  Pulse: (!) 104 97  Resp: 15 18  Temp: 37.3 C 37.2 C  SpO2:      Last Pain:  Vitals:   01/03/19 1126  TempSrc: Oral  PainSc:    Pain Goal:                @ANFLOW60MIN (12500)  )Cleda Clarks

## 2019-01-03 NOTE — Consult Note (Addendum)
WOC Nurse wound consult note Patient receiving care at [facility].  I have reviewed the photo in the chart from today at 04:57.  I have also spoken at length with the patient's primary RN, Morrie Sheldon. Reason for Consult: "possible sacral wound" Wound type: The patient has a DTPI to the right buttock.  There is erythema to the left buttock. Pressure Injury POA: No Measurement: Primary RN, Morrie Sheldon, will measure the DTPI as well as the erythema on the left buttock, and place those measurements in the patient record. Wound bed: Right buttock has a linear very darkened tissue, really dark purple.  The left buttock has an erythematous area.  Both buttocks have intact tissue overlying the discolored areas. Drainage (amount, consistency, odor) No odor, no drainage Periwound: Skin beyond the discolored areas are normal in color and texture Dressing procedure/placement/frequency: Topical dressings are not indicated for DTPIs or intact, erythematous tissue. Plan of care:  Sizewise bed with air mattress; use of Dermatherapy underpads if these are accessible; turning the patient to right or left, and AVOIDING pressure to the buttocks. The area needs to be monitored closely for further deterioration. Monitor the wound area(s) for worsening of condition such as: Signs/symptoms of infection,  Increase in size,  Development of or worsening of odor, Development of pain, or increased pain at the affected locations.  Notify the medical team if any of these develop.  Thank you for the consult.  Discussed plan of care with the bedside nurse.  Helmut Muster, RN, MSN, CWOCN, CNS-BC, pager [phone]

## 2019-01-03 NOTE — Discharge Summary (Signed)
Postpartum Discharge Summary    Patient Name: Kelly Lam DOB: 05-22-1996 MRN: [id number]  Date of admission: 01/01/2019 Delivering Provider: Tamera Lam, Kelly Lam   Date of discharge: 01/05/2019  Admitting diagnosis: 37 WKS INDUCTION OF LABOR for gHTN Intrauterine pregnancy: 2572w2d     Secondary diagnosis:  Principal Problem:   Encounter for planned induction of labor Active Problems:   Gestational hypertension  Additional problems: obesity (BMI 61) Discharge diagnosis: Gestational Hypertension, Term Pregnancy Delivered                           Post partum procedures: wound care nurse consult given pressure injury to buttock - new bed during admission   Induction: Cytotec, cervical foley, oxytocin, AROM Complications: pressure ulcer as noted above, see wound care consult for pictures  Hospital course:  Induction of Labor With Vaginal Delivery   23 y.o. yo G1P0 at 5972w2d was admitted to the hospital 01/01/2019 for induction of labor.  Indication for induction: Gestational hypertension.  Patient had an uncomplicated labor course with induction methods as above and details as follows: Membrane Rupture Time/Date: 5:44 PM ,01/02/2019   Intrapartum Procedures: Episiotomy: None [1]                                         Lacerations:  2nd degree [3];Perineal [11]  Patient had delivery of a Viable infant.  Information for the patient'Lam newborn:  Kelly Lam, Boy Kelly Lam [[id number]]  Delivery Method: Vag-Spont   Details of delivery can be found in separate delivery note.  Patient had a postpartum course complicated by limited mobility and pressure ulcer with buttock abrasion. Wound care nurse was consulted and special bed was provided. Patient also encouraged to get up and move frequently to avoid continued pressure on area. By day of discharge, she was passing gas, voiding without difficulty, and pain was well-controlled. Her blood pressures were well-controlled, and she denied symptoms of headache, vision  changes or abdominal pain. Patient is discharged home 01/05/19.  Magnesium Sulfate recieved: No BMZ received: No  Physical exam  Vitals:   01/03/19 2340 01/04/19 0519 01/04/19 2210 01/05/19 0458  BP: 110/74 106/68  124/65  Pulse: (!) 109 95  (!) 105  Resp: 18 18  18   Temp: 98 F (36.7 C) 97.8 F (36.6 C) 98 F (36.7 C) (!) 97.5 F (36.4 C)  TempSrc: Oral Oral Oral Oral  SpO2:      Weight:      Height:       General: alert, well-appearing, NAD Lochia: appropriate Uterine Fundus: firm, though difficult to palpate given body habitus  Incision: N/A DVT Evaluation: No significant calf/ankle edema. Labs: Lab Results  Component Value Date   WBC 13.8 (H) 01/02/2019   HGB 12.6 01/02/2019   HCT 37.5 01/02/2019   MCV 89.9 01/02/2019   PLT 231 01/02/2019   CMP Latest Ref Rng & Units 01/02/2019  Glucose 70 - 99 mg/dL 782(N101(H)  BUN 6 - 20 mg/dL 7  Creatinine 5.620.44 - 1.301.00 mg/dL 8.650.44  Sodium 784135 - 696145 mmol/L 136  Potassium 3.5 - 5.1 mmol/L 4.0  Chloride 98 - 111 mmol/L 109  CO2 22 - 32 mmol/L 19(L)  Calcium 8.9 - 10.3 mg/dL 2.9(B8.7(L)  Total Protein 6.5 - 8.1 g/dL 6.0(L)  Total Bilirubin 0.3 - 1.2 mg/dL 0.6  Alkaline Phos 38 - 126 U/L 86  AST 15 - 41 U/L 22  ALT 0 - 44 U/L 28    Discharge instruction: per After Visit Summary and "Baby and Me Booklet".  After visit meds:  Allergies as of 01/05/2019   No Known Allergies     Medication List    TAKE these medications   acetaminophen 325 MG tablet Commonly known as:  TYLENOL Take 325 mg by mouth every 6 (six) hours as needed for headache.   CALCIUM 1200 PO Take 1 tablet by mouth at bedtime.   ibuprofen 800 MG tablet Commonly known as:  ADVIL,MOTRIN Take 1 tablet (800 mg total) by mouth every 8 (eight) hours as needed.   multivitamin-prenatal 27-0.8 MG Tabs tablet Take 1 tablet by mouth daily at 12 noon.   senna-docusate 8.6-50 MG tablet Commonly known as:  Senokot-Lam Take 2 tablets by mouth at bedtime as needed for mild  constipation.   TYLENOL PM EXTRA STRENGTH PO Take 2 tablets by mouth at bedtime.      Diet: routine diet  Activity: Advance as tolerated. Pelvic rest for 6 weeks.   Outpatient follow up:4 weeks Follow up Appt: Future Appointments  Date Time Provider Department Center  01/11/2019  2:15 PM [facility] NURSE [facility] None  01/31/2019 10:30 AM Kelly Phenix, MD [facility] None   Follow up Visit: Follow-up Information    [facility]. Schedule an appointment as soon as possible for a visit.   Specialty:  Obstetrics and Gynecology Why:  You should receive a call to schedule a postpartum visit. If you do not, please call clinic to make an appointment in 4-6 weeks. You also need a blood pressure check within the week.  Contact information: [address] [place] [address] [phone]           Please schedule this patient for Postpartum visit in: 4 weeks with the following provider: Any provider For C/Lam patients schedule nurse incision check in weeks 2 weeks: no High risk pregnancy complicated by: HTN Delivery mode:  SVD Anticipated Birth Control:  POPs PP Procedures needed: BP check  Schedule Integrated BH visit: no  Newborn Data: Live born female  Birth Weight:   APGAR: 8, 9  Newborn Delivery   Birth date/time:  01/03/2019 08:24:00 Delivery type:  Vaginal, Spontaneous    Baby Feeding: Both Disposition:home with mother  01/05/2019 Kelly Stands, DO

## 2019-01-03 NOTE — Progress Notes (Signed)
Patient admitted to [facility] Rm. 115 at 1010 this morning.  Upon assessment of patient on admission, RN noted a Patient has a blackened area at the top of the buttock area on her Right side; she is red, warm to the touch, but blanchable on the left side. Patient did not feel pain while she had epidural for delivery but is now beginning to feel pain on the Right side.  Turned patient over to her Left side and placed pillow under her right.  MD notified and a wound care consult requested; verbal ok given by MD.  Wound Care contacted this RN and gave instructions to measure both areas.  The Right side that is blackened and not blanchable that is surrounded by red, blanchable skin is 12 cm wide and 5 cm in length.  The Left side (which is red and blanchable) is 7 cm wide and the height is 13 cm.    After a couple of hours of patient resting, RN encouraged patient to get up and ambulate to the bathroom.  With the assistance of two nurses, patient ambulated to the bathroom with only feeling slightly dizzy at the end as we were getting back in bed.  She attempted to void but did not have to at this time.  Fundus 1 below and bleeding small in amount.  RN educated patient about why she was having pain on her buttock and the importance of turning side to side and relieving pressure from that area now that she no longer has the epidural and can move.  Will continue to monitor closely.

## 2019-01-03 NOTE — Progress Notes (Signed)
Attempted to get up out of bed onto steady.  Felt dizzy.  Back to semifowlers.

## 2019-01-03 NOTE — Progress Notes (Signed)
LABOR PROGRESS NOTE  Kelly Lam is a 23 y.o. G1P0 at [redacted]w[redacted]d  admitted for IOL for gHTN.  Subjective: Doing well, feeling increased pressure   Objective: BP (!) 141/75   Pulse (!) 102   Temp 98.1 F (36.7 C) (Oral)   Resp 18   Ht 5\' 8"  (1.727 m)   Wt (!) 183.5 kg   LMP 04/17/2018 (Exact Date)   SpO2 97%   BMI 61.50 kg/m  or  Vitals:   01/03/19 0102 01/03/19 0132 01/03/19 0202 01/03/19 0232  BP: (!) 147/75 (!) 154/74 (!) 145/74 (!) 141/75  Pulse: (!) 102 98 (!) 107 (!) 102  Resp: 18 18 18 18   Temp:      TempSrc:      SpO2:      Weight:      Height:        0220 Dilation: 7.5 Effacement (%): 80 Cervical Position: Anterior Station: -1, 0 Presentation: Vertex Exam by:: Amberlie Gaillard FSE: baseline rate 135, moderate varibility, no acel, no decel IUPC: 150 MVU's, every 1-3 minutes lasting 40-60 seconds  Pitocin: 16 mu/min   Labs: Lab Results  Component Value Date   WBC 13.8 (H) 01/02/2019   HGB 12.6 01/02/2019   HCT 37.5 01/02/2019   MCV 89.9 01/02/2019   PLT 231 01/02/2019    Patient Active Problem List   Diagnosis Date Noted  . Encounter for planned induction of labor 01/01/2019  . Gestational hypertension 12/01/2018  . 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 Supervision of high risk pregnancy, antepartum 08/15/2018  . Morbid obesity (HCC) 08/15/2018    Assessment / Plan: 23 y.o. G1P0 at [redacted]w[redacted]d here for IOL for gHTN.    GHTN: no severe range BP's, continue to monitor  Labor: active labor, progressing, continue to titrate pitocin for adequate MVU's as long as fetus  tolerates Fetal Wellbeing:  Category 1  Pain Control:  Epidural  Anticipated MOD:  SVD  Linh Hedberg SNM  01/03/2019, 2:48 AM

## 2019-01-03 NOTE — Progress Notes (Signed)
Reassessed pressure injury on buttocks with MD Dr. Earlene Plater at the bedside.  There is now an area about 2 cm wide on the left buttock that is not blanchable in the center of the reddened area.  Wound care called and voicemail left with no patient information, only hospital unit to return call to.  MD aware as she was bedside.

## 2019-01-04 ENCOUNTER — Encounter (HOSPITAL_COMMUNITY): Payer: Self-pay

## 2019-01-04 NOTE — Progress Notes (Signed)
I spoke with driver for specialty bed. Driver stated he would be here with bed in one hour to one hour in half for patient. Will continue to monitor pt.

## 2019-01-04 NOTE — Progress Notes (Signed)
Pt walking halls and changing positions often.

## 2019-01-04 NOTE — Progress Notes (Signed)
Discussed Plan of Care with Patient regarding her sacral wounds. Encouraged pt to change position frequently and to not sit on harder surfaces for longer lengths of time. Discussed risks of not following her plan of care as patient expressed that the air bed is uncomfortable and she has been sitting straight up in her room recliner and couch for a couple hours as that was her position at shift change. Family present for discussion and agreed with plan of care. The patient reluctantly agreed.

## 2019-01-04 NOTE — Consult Note (Addendum)
WOC Nurse wound follow up Patient receiving care in [facility].  Two family members present at the time of assessment. 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  The total area that includes the erythema and dark purple segment measures 6 cm x 11 cm.  There are no open areas at this time. The left buttock has an area of erythema that measures 7 cm x 7 cm.  At the base of this erythema and close to the gluteal fold, is a non-blanchable purple spot that measures 0.5 cm x 1.1 cm. This spot is consistent with a DTPI. There is no open area to this buttock either.   There is no induration or odor to the wounds. The SizeWise bed with air mattress is in place.  Staff have also begun the use of [employer] clear to the buttocks.  I explained to the patient and her visitors this can be used as often as they wish to do so to these sites.  I also explained that we cannot predict if the purple areas will dry up, flake off, and heal; or, if they might progress to be yellow/brown and "gooey".  If they progress to this type of wound, they will need a different type of treatment to get them to improve.  For now, the number one way to assist the areas to improve are to keep the pressure off the area by lying on her right or left sides, and avoiding being on her back.  Of note, when I walked into the room the patient was asleep in a supine position.  The patient verbalized her understanding of the information I provided to her. Monitor the wound area(s) for worsening of condition such as: Signs/symptoms of infection,  Increase in size,  Development of or worsening of odor, Development of pain, or increased pain at the affected locations.  Notify the medical team if any of these develop.  Thank you for the consult.  Discussed plan of care with the patient and bedside nurse.  WOC nurse will not follow at  this time.  Please re-consult the WOC team if needed.  Helmut Muster, RN, MSN, CWOCN, CNS-BC, pager [phone]

## 2019-01-04 NOTE — Lactation Note (Addendum)
This note was copied from a baby's chart. Lactation Consultation Note  Patient Name: Kelly Lam VZCHY'I Date: 01/04/2019 Reason for consult: Initial assessment;Early term 37-38.6wks;Primapara;1st time breastfeeding  P1 mother whose infant is now 58 hours old.  Mother has recently decided that she would like to breast feed.  DEBP initiated around 1300 today for mother by RN.  Mother is on a wound care bed for sacral wounds.  Baby being held by visitors and not showing feeding cues when I arrived.  Encouraged mother to begin breast feeding now if she is interested in latching.  Encouraged her to feed 8-12 times/24 hours or sooner if baby shows feeding cues.  Reviewed feeding cues with parents.  Mother stated she is familiar with hand expression.  Colostrum container provided for any EBM she may obtain with hand expression.  Since she has not had any breast stimulation at all since delivery I emphasized the importance of breast feeding first and then pumping after with the DEBP to help increase milk supply.  Mother verbalized understanding, although, I am not confident she will continue this.    Suggested mother call for latch assistance from her RN/LC at the next feeding emphasizing again that baby has not been practicing and hospital stay will be short.  I want mother to feel fairly comfortable prior to discharge day.    Mom made aware of O/P services, breastfeeding support groups, community resources, and our phone # for post-discharge questions.  She will be a "stay at home" mother after discharge and has a DEBP for home use.  Father and visitors present.  RN updated.   Maternal Data Formula Feeding for Exclusion: No Has patient been taught Hand Expression?: Yes Does the patient have breastfeeding experience prior to this delivery?: No  Feeding    LATCH Score                   Interventions    Lactation Tools Discussed/Used Initiated by:: Already initiated   Consult  Status Consult Status: Follow-up Date: 01/05/19 Follow-up type: In-patient    Dora Sims 01/04/2019, 6:17 PM

## 2019-01-04 NOTE — Plan of Care (Signed)
Progressing appropriately. Encouraged to call for assistance as needed. Called about specialty bed delivery, have not heard back.

## 2019-01-04 NOTE — Consult Note (Signed)
WOC Nurse wound follow up Patient receiving care at [facility].   Wound type: DTPI to buttock I spoke with the patient's primary RN, Brook via telephone this morning at 07:42.  I learned that the SizeWise bed with air mattress was not delivered.  The confirmation number given to the staff at the time of order is [id number].  Staff were told the product should have been delivered by midnight last night.  At 1:00 a.m. this morning a staff person called the order center for the company, and was told they would locate the driver and find out where the product was.  They did not hear anything back after that.  I called the [phone] number, gave Marchelle Folks the confirmation number and asked where the product is.  She stated she spoke to the Camera operator by phone, he would investigate the situation, and call me back.  Thus far I have not heard from him.  While waiting on a call back from the Operation Manager I reached out to our facility representative, [place].  I explained the situation. He investigated the order and called me back to explain that the break down occurred on their company side, that our staff had done everything as they should have to get the product.  He also stated he had spoken with the Operation Manager and that person would be contacting me.  Thus far I have not received a call back.  I plan to go see the patient later today as I understand from an RN progress note and phone message, that there is an area on the left buttock that not longer blanches. One final note, I have learned that Dermatherapy linen is not available to patients at [facility]. Helmut Muster, RN, MSN, [facility], CNS-BC, pager [phone]

## 2019-01-04 NOTE — Progress Notes (Signed)
Pt encouraged to change positions frequently, at least every two hours. Advised pt to sit on pillow when in chair. I also advised pt to switch from large yellow pad to small white pad. Pt also encouraged to lay on side when in bed.  Pt c/o no pain at this time.

## 2019-01-04 NOTE — Progress Notes (Signed)
Derma therapy linen arrived from [facility].  Pt still sitting in chair from earlier. Pt encouraged to ambulate in hall at least three times daily or more. Advised pt to change position at least every two hours. Will continue to monitor pt.

## 2019-01-04 NOTE — Progress Notes (Signed)
Post Partum Day 1 Subjective: no complaints, up ad lib, voiding, tolerating PO and + flatus  Very tired this am. Did not sleep well. No pain from known bilateral sacral wounds  Objective: Blood pressure 106/68, pulse 95, temperature 97.8 F (36.6 C), temperature source Oral, resp. rate 18, height 5\' 8"  (1.727 m), weight (!) 183.5 kg, last menstrual period 04/17/2018, SpO2 100 %, unknown if currently breastfeeding.  Physical Exam:  General: alert, cooperative and appears stated age Lochia: appropriate Uterine Fundus: soft Incision: N/A DVT Evaluation: No evidence of DVT seen on physical exam. Negative Homan's sign. No cords or calf tenderness. Left stage 1 sacral pressure ulcer Unchanged right sacral wound, seen previously       Recent Labs    01/02/19 0645  HGB 12.6  HCT 37.5    Assessment/Plan: Plan for discharge tomorrow and Circumcision prior to discharge  Will get wound care to consult on sacral wounds. Need wound care bed.   LOS: 3 days   [name] 01/04/2019, 9:31 AM

## 2019-01-04 NOTE — Progress Notes (Signed)
Specialty bed arrived for pt and set up in pt room. Will continue to monitor pt.

## 2019-01-05 MED ORDER — SENNOSIDES-DOCUSATE SODIUM 8.6-50 MG PO TABS: 2.0000 | ORAL_TABLET | Freq: Every evening | ORAL | 0 refills | Status: DC | PRN

## 2019-01-05 MED ORDER — IBUPROFEN 800 MG PO TABS: 800.0000 mg | ORAL_TABLET | Freq: Three times a day (TID) | ORAL | 0 refills | Status: DC | PRN

## 2019-01-05 NOTE — Lactation Note (Signed)
This note was copied from a baby's chart. Lactation Consultation Note  Patient Name: Kelly Lam KCLEX'N Date: 01/05/2019   Baby 51 hours old and mother states she is interested in breastfeeding and pumping. Breast pump set up in room but mother states she has not received any volume. Mother has inverted nipples. Reviewed hand expression and had mother pump w/ manual pump. Good flow of colostrum. Attempted latching baby in football but baby sleepy after recent bottle of formula. Mother states she has DEBP at home.  Encouraged mother to pump q 2-3 hours. Reviewed milk storage and engorgement care.     Maternal Data    Feeding Nipple Type: Slow - flow  LATCH Score                   Interventions    Lactation Tools Discussed/Used     Consult Status      Hardie Pulley 01/05/2019, 12:19 PM

## 2019-01-05 NOTE — Progress Notes (Signed)
Advised pt or support person to  look at sacrum daily. Advised pt to notify MD for swelling, increased redness, or foul smelling drainage. Encouraged pt to continue to change position at least every 2 hours and to continue to ambulate frequently. Pt also encouraged to sit on pillow when sitting on firm surfaces.Reviewed risk of further injury to tissue if not follow ing plan of care.  Pt and family verbalized understanding.

## 2019-01-09 ENCOUNTER — Telehealth: Payer: Self-pay

## 2019-01-09 ENCOUNTER — Inpatient Hospital Stay (HOSPITAL_COMMUNITY)
Admission: AD | Admit: 2019-01-09 | Discharge: 2019-01-09 | Disposition: A | Discharge status: Home / Self Care | Payer: BC Managed Care – PPO | Attending: Obstetrics and Gynecology | Admitting: Obstetrics and Gynecology

## 2019-01-09 ENCOUNTER — Encounter (HOSPITAL_COMMUNITY): Payer: Self-pay | Admitting: *Deleted

## 2019-01-09 ENCOUNTER — Inpatient Hospital Stay (HOSPITAL_COMMUNITY): Payer: BC Managed Care – PPO

## 2019-01-09 DIAGNOSIS — G8929 Other chronic pain: Secondary | ICD-10-CM | POA: Diagnosis not present

## 2019-01-09 DIAGNOSIS — M549 Dorsalgia, unspecified: Secondary | ICD-10-CM | POA: Insufficient documentation

## 2019-01-09 DIAGNOSIS — M545 Low back pain, unspecified: Secondary | ICD-10-CM

## 2019-01-09 DIAGNOSIS — O9089 Other complications of the puerperium, not elsewhere classified: Secondary | ICD-10-CM | POA: Insufficient documentation

## 2019-01-09 DIAGNOSIS — M542 Cervicalgia: Secondary | ICD-10-CM | POA: Insufficient documentation

## 2019-01-09 HISTORY — DX: Gestational (pregnancy-induced) hypertension without significant proteinuria, unspecified trimester: O13.9

## 2019-01-09 LAB — URINALYSIS, ROUTINE W REFLEX MICROSCOPIC
Bilirubin Urine: NEGATIVE
Glucose, UA: NEGATIVE mg/dL
Ketones, ur: 5 mg/dL — AB
Nitrite: NEGATIVE
Protein, ur: 30 mg/dL — AB
RBC / HPF: >50 RBC/hpf — ABNORMAL HIGH (ref 0–5)
SPECIFIC GRAVITY, URINE: 1.014 (ref 1.005–1.030)
WBC, UA: >50 WBC/hpf — ABNORMAL HIGH (ref 0–5)
pH: 8 (ref 5.0–8.0)

## 2019-01-09 MED ORDER — CEPHALEXIN 500 MG PO CAPS: 500.0000 mg | ORAL_CAPSULE | Freq: Three times a day (TID) | ORAL | 0 refills | Status: AC

## 2019-01-09 MED ORDER — CYCLOBENZAPRINE HCL 10 MG PO TABS
10.0000 mg | ORAL_TABLET | Freq: Once | ORAL | Status: AC
  Administered 2019-01-09: 10 mg via ORAL
  Filled 2019-01-09: qty 1

## 2019-01-09 MED ORDER — CYCLOBENZAPRINE HCL 10 MG PO TABS: 10.0000 mg | ORAL_TABLET | Freq: Two times a day (BID) | ORAL | 0 refills | Status: DC | PRN

## 2019-01-09 NOTE — Discharge Instructions (Signed)

## 2019-01-09 NOTE — Telephone Encounter (Signed)
Pt called stating that she is having shortness of breath and swelling in her legs that started last night. She states that her bp has been normal. Pt advised to go to [facility] for evaluation.

## 2019-01-09 NOTE — MAU Provider Note (Signed)
History     CSN: [id number]  Arrival date and time: 01/09/19 1220   First Provider Initiated Contact with Patient 01/09/19 1534      Chief Complaint  Patient presents with  . Back Pain  . Neck Pain  . Shortness of Breath  . Dysuria   Kelly Lam is a 23 y.o. G1P1001 at Unknown who presents for Back Pain; Neck Pain; Shortness of Breath; and Dysuria.  She states she has been experiencing neck and back spasms since delivery.  She endorses a history of back pain and states it feels like her neck and back are "seizing up."  She also reports feelings of SOB with activity and when she attempts to catch her breath she has a sharp pain in her sternum area that is relieved with sitting down and taking breaths.  She states this started late last night and does not occur outside of exertion.  Patient also complains of pain with urination that started about 2-3 days ago.  She endorses proper hydration and denies issues with constipation, diarrhea, and feels like she is completely empyting her bladder.       OB History    Gravida  1   Para  1   Term  1   Preterm      AB      Living  1     SAB      TAB      Ectopic      Multiple  0   Live Births  1           Past Medical History:  Diagnosis Date  . Gestational hypertension 12/01/2018  . Morbid obesity with BMI of 50.0-59.9, adult (HCC)   . Pregnancy induced hypertension   . Urinary tract infection     Past Surgical History:  Procedure Laterality Date  . HERNIA REPAIR    . TONSILLECTOMY    . WISDOM TOOTH EXTRACTION      Family History  Problem Relation Age of Onset  . Hypertension Father   . Diabetes Maternal Grandfather   . Congestive Heart Failure Maternal Grandfather   . Cancer Maternal Grandfather   . Huntington's disease Paternal Grandfather     Social History   Tobacco Use  . Smoking status: Never Smoker  . Smokeless tobacco: Never Used  Substance Use Topics  . Alcohol use: No  . Drug use:  Never    Allergies: No Known Allergies  Medications Prior to Admission  Medication Sig Dispense Refill Last Dose  . acetaminophen (TYLENOL) 325 MG tablet Take 325 mg by mouth every 6 (six) hours as needed for headache.   12/30/2018  . Calcium Carbonate-Vit D-Min (CALCIUM 1200 PO) Take 1 tablet by mouth at bedtime.    12/31/2018 at Unknown time  . diphenhydrAMINE-APAP, sleep, (TYLENOL PM EXTRA STRENGTH PO) Take 2 tablets by mouth at bedtime.    12/31/2018 at Unknown time  . ibuprofen (ADVIL,MOTRIN) 800 MG tablet Take 1 tablet (800 mg total) by mouth every 8 (eight) hours as needed. 30 tablet 0   . Prenatal Vit-Fe Fumarate-FA (MULTIVITAMIN-PRENATAL) 27-0.8 MG TABS tablet Take 1 tablet by mouth daily at 12 noon.   12/31/2018 at Unknown time  . senna-docusate (SENOKOT-S) 8.6-50 MG tablet Take 2 tablets by mouth at bedtime as needed for mild constipation. 10 tablet 0     Review of Systems  Constitutional: Negative for chills and fever.  Respiratory: Positive for shortness of breath. Negative for cough and chest  tightness.   Cardiovascular: Positive for chest pain.  Gastrointestinal: Negative for constipation, diarrhea, nausea and vomiting.  Genitourinary: Positive for dysuria and urgency. Negative for frequency.  Musculoskeletal: Positive for back pain and neck pain.  Neurological: Negative for dizziness, seizures, light-headedness and headaches.   Physical Exam   Blood pressure 123/75, pulse 80, temperature 98.5 F (36.9 C), temperature source Oral, resp. rate 20, weight (!) 182.2 kg, SpO2 97 %, not currently breastfeeding.  Physical Exam  Constitutional: She is oriented to person, place, and time.  Morbidly Obese  HENT:  Head: Normocephalic and atraumatic.  Eyes: Conjunctivae are normal.  Neck: Normal range of motion.  Cardiovascular: Normal rate, regular rhythm and normal heart sounds.  Respiratory: Effort normal and breath sounds normal.  GI: Soft. Bowel sounds are normal.   Musculoskeletal: Normal range of motion.  Neurological: She is alert and oriented to person, place, and time.  Skin: Skin is warm and dry.  Psychiatric: She has a normal mood and affect. Her behavior is normal.    MAU Course  Procedures Chest Ray FINDINGS: Normal heart size, mediastinal contours, and pulmonary vascularity.  Minimal chronic peribronchial thickening.  Lungs otherwise clear.  No infiltrate, pleural effusion, or pneumothorax.  Bones unremarkable.  IMPRESSION: No acute abnormalities.  MDM PE Muscle Relaxant Labs: UA Chest X Ray Assessment and Plan  S/P SVD-5 Days PP Back and Neck Pain/Spasm   -Exam findings discussed -Discussed UA findings and will treat based on symptoms. -Rx for Keflex 500mg  TID x 10 days Disp 30, RF 0 -Will send UC-Pending -Will give flexeril and reassess back pain  Follow Up (4:24 PM) SOB  -Patient reports that she has had minimal relief with flexeril as pain is now 5/10 -Consulted with 2nd provider who suggests Chest X Ray -Will send for Chest XRay for exertional SOB  Follow Up (1800) -Results return negative -Patient reports improvement of back pain since last assessment -Rx for Flexeril 10mg  BID PRN Disp 21, RF 0 -Encouraged to call or return to MAU if symptoms worsen or with the onset of new symptoms. -Instructed to keep scheduled appt for BP check at [facility] -Discharged to home in stable condition  Cherre Robins MSN, CNM 01/09/2019, 3:34 PM

## 2019-01-09 NOTE — MAU Note (Signed)
Started last night, back and neck spasms, fatigue last couple days and painful urination 2 or 3 days ago.  SOB started today. vaginal delivery 1/14.

## 2019-01-10 LAB — CULTURE, OB URINE: Culture: 30000 — AB

## 2019-01-31 ENCOUNTER — Ambulatory Visit (INDEPENDENT_AMBULATORY_CARE_PROVIDER_SITE_OTHER): Payer: BC Managed Care – PPO | Admitting: Obstetrics & Gynecology

## 2019-01-31 ENCOUNTER — Other Ambulatory Visit: Payer: Self-pay

## 2019-01-31 ENCOUNTER — Ambulatory Visit: Payer: Medicaid Other | Admitting: Obstetrics & Gynecology

## 2019-01-31 VITALS — Ht 68.0 in | Wt 375.2 lb

## 2019-01-31 DIAGNOSIS — F53 Postpartum depression: Secondary | ICD-10-CM | POA: Diagnosis not present

## 2019-01-31 DIAGNOSIS — Z1389 Encounter for screening for other disorder: Secondary | ICD-10-CM | POA: Diagnosis not present

## 2019-01-31 DIAGNOSIS — O99345 Other mental disorders complicating the puerperium: Secondary | ICD-10-CM | POA: Diagnosis not present

## 2019-01-31 MED ORDER — TRI-PREVIFEM 0.18/0.215/0.25 MG-35 MCG PO TABS: 1.0000 | ORAL_TABLET | Freq: Every day | ORAL | 11 refills | Status: DC

## 2019-01-31 NOTE — Patient Instructions (Signed)
Perinatal Anxiety °When a woman feels excessive tension or worry (anxiety) during pregnancy or during the first 12 months after she gives birth, she has a condition called perinatal anxiety. Anxiety can interfere with work, school, relationships, and other everyday activities. If it is not managed properly, it can also cause problems in the mother and her baby.  °If you are pregnant and you have symptoms of an anxiety disorder, it is important to talk with your health care provider. °What are the causes? °The exact cause of this condition is not known. Hormonal changes during and after pregnancy may play a role in causing perinatal anxiety. °What increases the risk? °You are more likely to develop this condition if: °· You have a personal or family history of depression, anxiety, or mood disorders. °· You experience a stressful life event during pregnancy, such as the death of a loved one. °· You have a lot of regular life stress, such as being a single parent. °· You have thyroid problems. °What are the signs or symptoms? °Perinatal anxiety can be different for everyone. It may include: °· Panic attacks (panic disorder). These are intense episodes of fear or discomfort that may also cause sweating, nausea, shortness of breath, or fear of dying. They usually last 5-15 minutes. °· Reliving an upsetting (traumatic) event through distressing thoughts, dreams, or flashbacks (post-traumatic stress disorder, or PTSD). °· Excessive worry about multiple problems (generalized anxiety disorder). °· Fear and stress about leaving certain people or loved ones (separation anxiety). °· Performing repetitive tasks (compulsions) to relieve stress or worry (obsessive compulsive disorder, or OCD). °· Fear of certain objects or situations (phobias). °· Excessive worrying, such as a constant feeling that something bad is going to happen. °· Inability to relax. °· Difficulty concentrating. °· Sleep problems. °· Frequent nightmares or  disturbing thoughts. °How is this diagnosed? °This condition is diagnosed based on a physical exam and mental evaluation. In some cases, your health care provider may use an anxiety screening tool. These tools include a list of questions that can help a health care provider diagnose anxiety. Your health care provider may refer you to a mental health expert who specializes in anxiety. °How is this treated? °This condition may be treated with: °· Medicines. Your health care provider will only give you medicines that have been proven safe for pregnancy and breastfeeding. °· Talk therapy with a mental health professional to help change your patterns of thinking (cognitive behavioral therapy). °· Mindfulness-based stress reduction. °· Other relaxation therapies, such as deep breathing or guided muscle relaxation. °· Support groups. °Follow these instructions at home: °Lifestyle °· Do not use any products that contain nicotine or tobacco, such as cigarettes and e-cigarettes. If you need help quitting, ask your health care provider. °· Do not use alcohol when you are pregnant. After your baby is born, limit alcohol intake to no more than 1 drink a day. One drink equals 12 oz of beer, 5 oz of wine, or 1½ oz of hard liquor. °· Consider joining a support group for new mothers. Ask your health care provider for recommendations. °· Take good care of yourself. Make sure you: °? Get plenty of sleep. If you are having trouble sleeping, talk with your health care provider. °? Eat a healthy diet. This includes plenty of fruits and vegetables, whole grains, and lean proteins. °? Exercise regularly, as told by your health care provider. Ask your health care provider what exercises are safe for you. °General instructions °· Take over-the-counter   and prescription medicines only as told by your health care provider. °· Talk with your partner or family members about your feelings during pregnancy. Share any concerns or fears that you may  have. °· Ask for help with tasks or chores when you need it. Ask friends and family members to provide meals, watch your children, or help with cleaning. °· Keep all follow-up visits as told by your health care provider. This is important. °Contact a health care provider if: °· You (or people close to you) notice that you have any symptoms of anxiety or depression. °· You have anxiety and your symptoms get worse. °· You experience side effects from medicines, such as nausea or sleep problems. °Get help right away if: °· You feel like hurting yourself, your baby, or someone else. °If you ever feel like you may hurt yourself or others, or have thoughts about taking your own life, get help right away. You can go to your nearest emergency department or call: °· Your local emergency services (911 in the [place] °· A suicide crisis helpline, such as the National Suicide Prevention Lifeline at 1-800-273-8255. This is open 24 hours a day. °Summary °· Perinatal anxiety is when a woman feels excessive tension or worry during pregnancy or during the first 12 months after she gives birth. °· Perinatal anxiety may include panic attacks, post-traumatic stress disorder, separation anxiety, phobias, or generalized anxiety. °· Perinatal anxiety can cause physical health problems in the mother and baby if not properly managed. °· This condition is treated with medicines, talk therapy, stress reduction therapies, or a combination of two or more treatments. °· Talk with your partner or family members about your concerns or fears. Do not be afraid to ask for help. °This information is not intended to replace advice given to you by your health care provider. Make sure you discuss any questions you have with your health care provider. °Document Released: 02/03/2017 Document Revised: 02/03/2017 Document Reviewed: 02/03/2017 °Elsevier Interactive Patient Education © 2019 [employer] ° °

## 2019-01-31 NOTE — Progress Notes (Signed)
Subjective:     Kelly Lam is a 23 y.o. female who presents for a postpartum visit. She is 4 weeks postpartum following a spontaneous vaginal delivery. I have fully reviewed the prenatal and intrapartum course. The delivery was at 37.0 gestational weeks. Outcome: spontaneous vaginal delivery. Anesthesia: epidural. Postpartum course has been Unremarkable. Baby's course has been Unremarkable Baby is feeding by bottle - Carnation Good Start DHA and ARA and Similac Advance. Bleeding no bleeding. Bowel function is normal. Bladder function is normal. Patient is not sexually active. Contraception method is none. Postpartum depression screening: negative. She states she has anxiety and depression and requests f/u for this The following portions of the patient's history were reviewed and updated as appropriate: allergies, current medications, past family history, past medical history, past social history, past surgical history and problem list.  Review of Systems Pertinent items are noted in HPI.   Objective:    Ht 5\' 8"  (1.727 m)   Wt (!) 375 lb 3.2 oz (170.2 kg)   Breastfeeding No   BMI 57.05 kg/m   General:  alert, cooperative and no distress   Breasts:           Abdomen: soft, non-tender; bowel sounds normal; no masses,  no organomegaly and obese   Vulva:  not evaluated  Vagina: not evaluated                    Assessment:     4 week postpartum exam. Pap smear not done at today's visit.  Needs referral to behavioral health, [facility] Morita Plan:    1. Contraception: OCP (estrogen/progesterone) 2. Referral made 3. Follow up as needed.     Adam Phenix, MD 01/31/2019

## 2019-02-07 ENCOUNTER — Institutional Professional Consult (permissible substitution): Payer: BC Managed Care – PPO

## 2019-07-30 IMAGING — US US MFM OB FOLLOW-UP
1 series · 13 of 28 positions shown · non-contrast
Comparison: none

[Series 1: us mfm ob follow-up · 53 acquisitions, 13 frames shown]
[im 2/53]
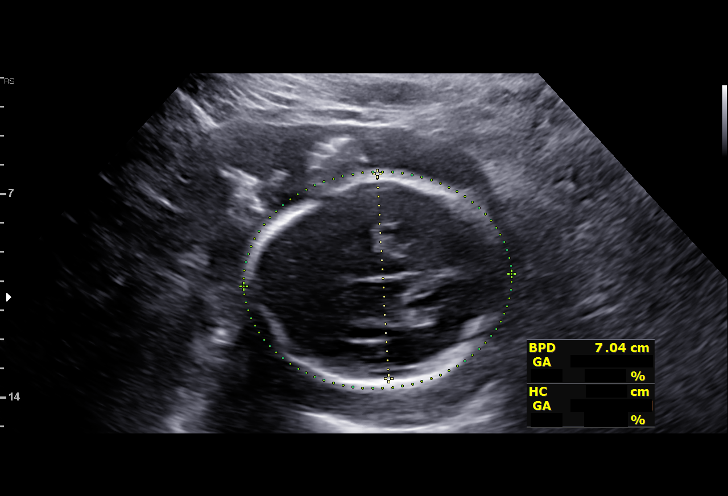
[im 6/53]
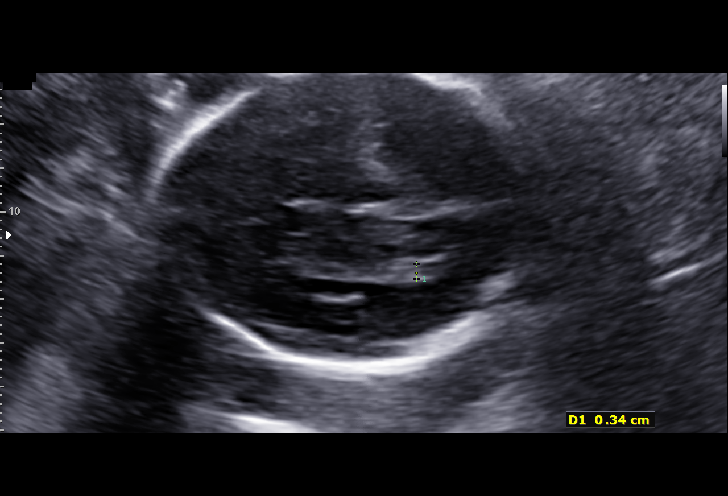
[im 10/53]
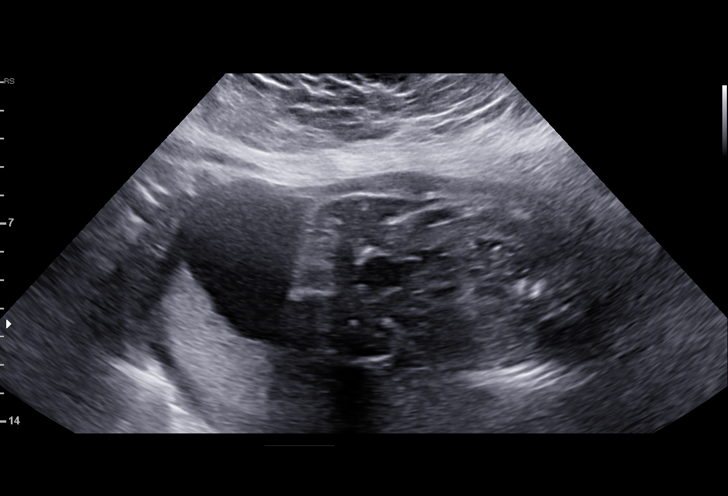
[im 14/53]
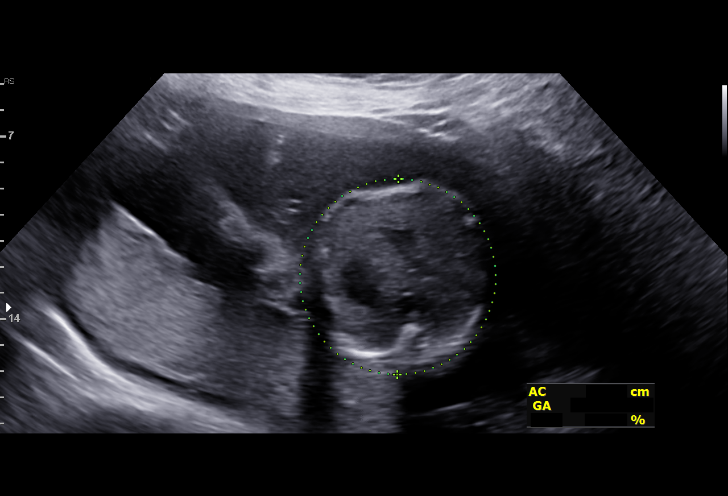
[im 18/53]
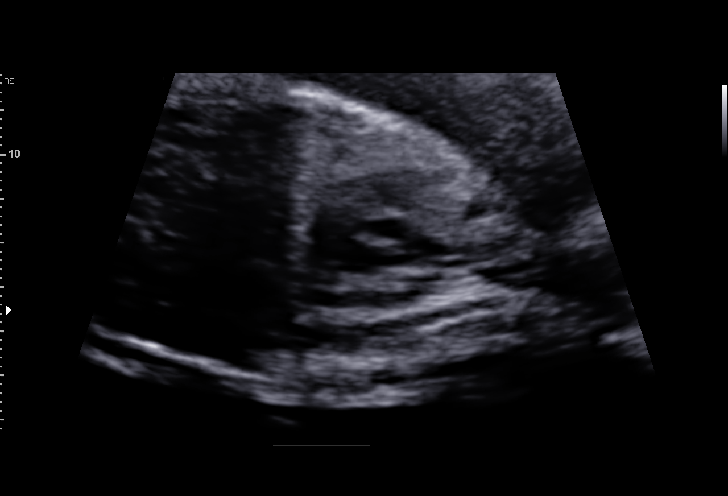
[im 22/53]
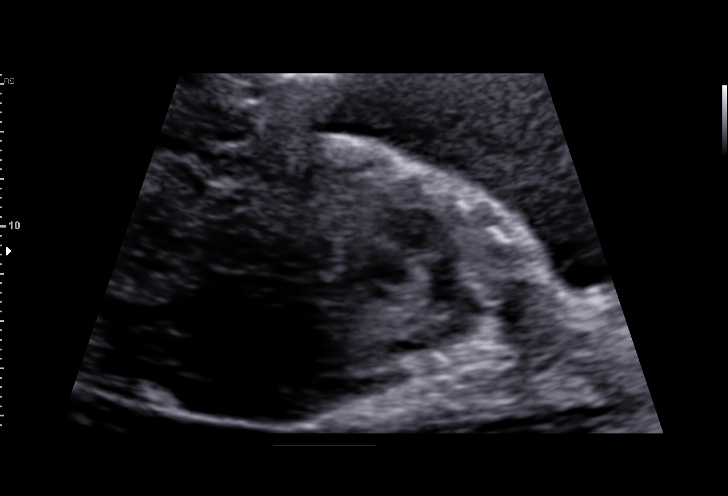
[im 27/53]
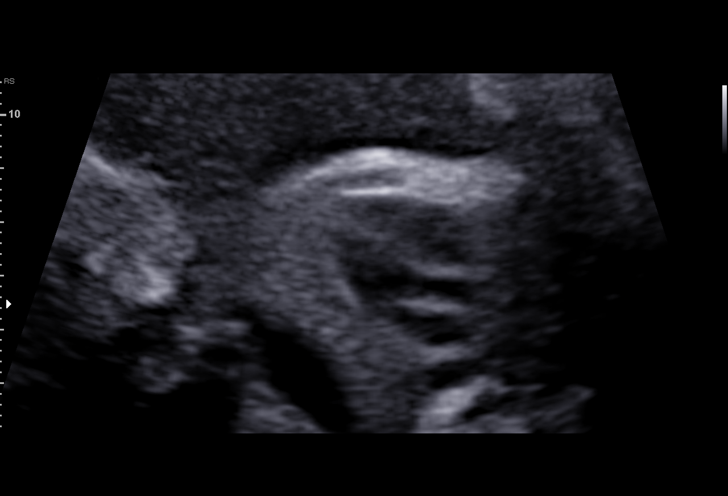
[im 31/53]
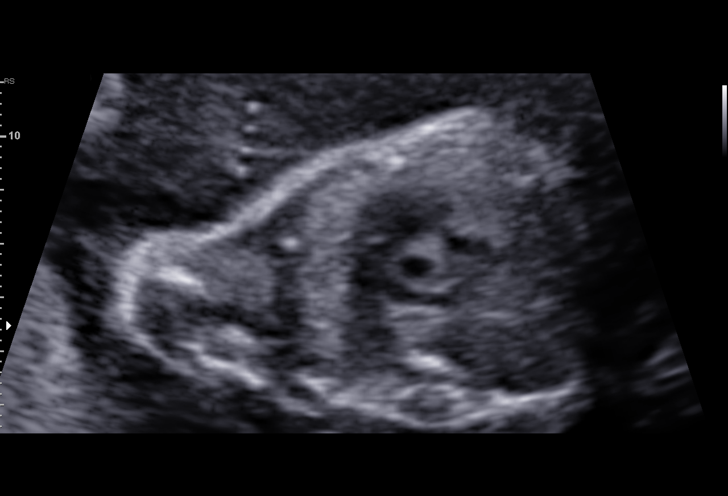
[im 35/53]
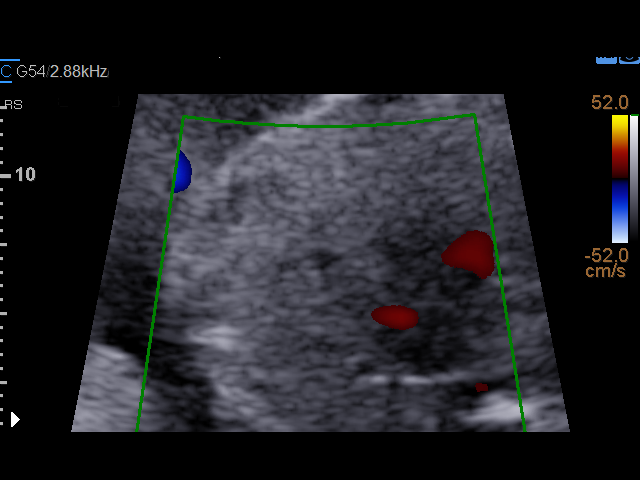
[im 39/53]
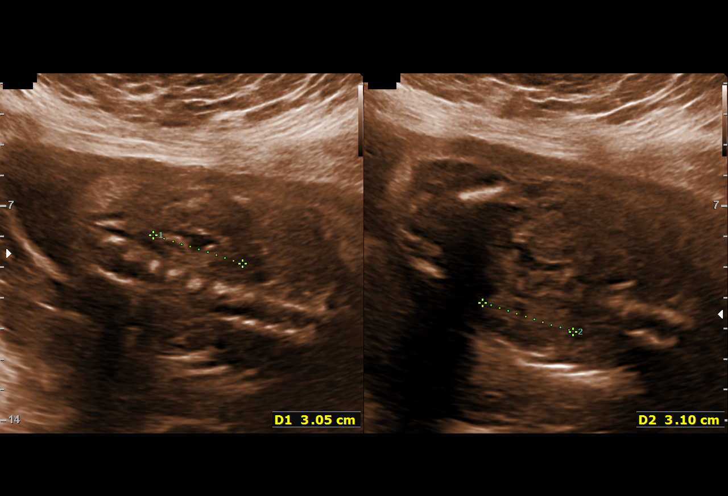
[im 43/53]
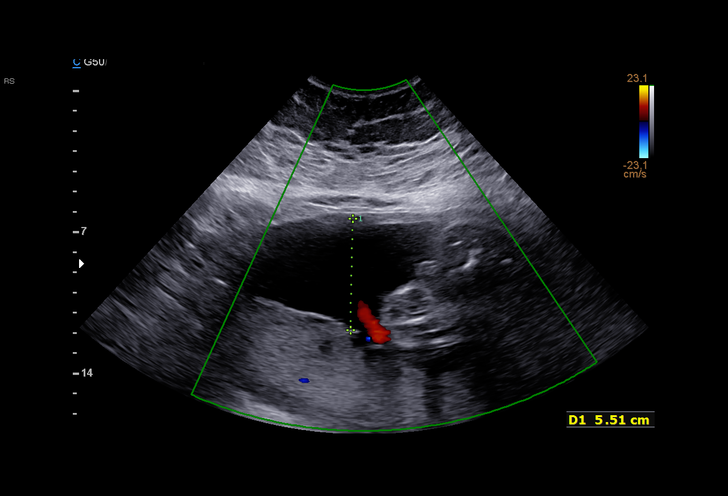
[im 47/53]
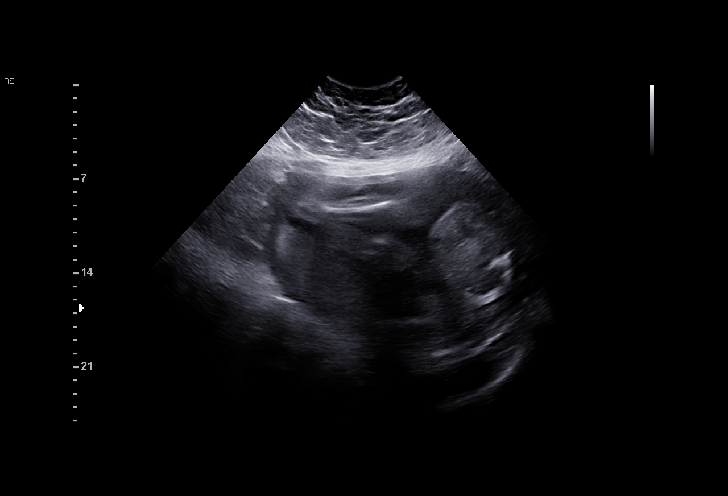
[im 51/53]
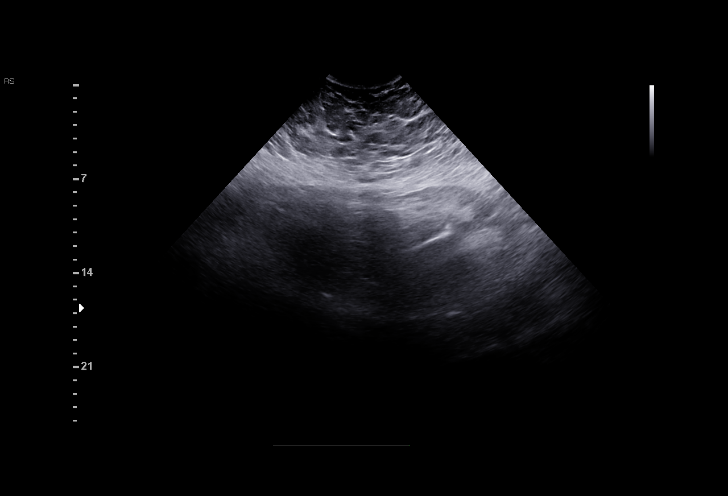

[13 of 28 positions shown; findings below may reference images not displayed]

----------------------------------------------------------------------

 ----------------------------------------------------------------------
Indications

  Encounter for other antenatal screening
  follow-up
  Antenatal follow-up for nonvisualized fetal
  anatomy
  Obesity complicating pregnancy, second
  trimester
  27 weeks gestation of pregnancy
 ----------------------------------------------------------------------
Vital Signs

 BMI:
Fetal Evaluation

 Num Of Fetuses:         1
 Fetal Heart Rate(bpm):  145
 Cardiac Activity:       Observed
 Presentation:           Cephalic
 Placenta:               Posterior
 P. Cord Insertion:      Previously Visualized

 Amniotic Fluid
 AFI FV:      Within normal limits

                             Largest Pocket(cm)

Biometry

 BPD:      70.5  mm     G. Age:  28w 2d         68  %    CI:         75.6   %    70 - 86
                                                         FL/HC:      18.7   %    18.6 -
 HC:      257.1  mm     G. Age:  27w 6d         38  %    HC/AC:      1.08        1.05 -
 AC:      237.1  mm     G. Age:  28w 0d         60  %    FL/BPD:     68.1   %    71 - 87
 FL:         48  mm     G. Age:  26w 1d          8  %    FL/AC:      20.2   %    20 - 24
 HUM:      46.5  mm     G. Age:  27w 3d         46  %

 Est. FW:    1411  gm      2 lb 6 oz     51  %
OB History

 Gravidity:    1
Gestational Age

 LMP:           27w 3d        Date:  04/17/18                 EDD:   01/22/19
 U/S Today:     27w 4d                                        EDD:   01/21/19
 Best:          27w 3d     Det. By:  LMP  (04/17/18)          EDD:   01/22/19
Anatomy

 Cranium:               Appears normal         Aortic Arch:            Appears normal
 Cavum:                 Previously seen        Ductal Arch:            Appears normal
 Ventricles:            Previously seen        Diaphragm:              Previously seen
 Choroid Plexus:        Previously seen        Stomach:                Appears normal, left
                                                                       sided
 Cerebellum:            Previously seen        Abdomen:                Appears normal
 Posterior Fossa:       Previously seen        Abdominal Wall:         Previously seen
 Nuchal Fold:           Not applicable (>20    Cord Vessels:           Previously seen
                        wks GA)
 Face:                  Orbits and profile     Kidneys:                Appear normal
                        previously seen
 Lips:                  Previously seen        Bladder:                Appears normal
 Thoracic:              Appears normal         Spine:                  Ltd views previously
                                                                       seen
 Heart:                 Not well visualized    Upper Extremities:      Previously seen
 RVOT:                  Appears normal         Lower Extremities:      Previously seen
 LVOT:                  Appears normal

 Other:  Male gender. Nasal bone prev visualized. Technically difficult due to
         maternal habitus and fetal position.
Cervix Uterus Adnexa

 Cervix
 Not visualized (advanced GA >33wks)

 Uterus
 No abnormality visualized.

 Left Ovary
 Not visualized.

 Right Ovary
 Not visualized.

 Adnexa
 No abnormality visualized. No adnexal mass
 visualized.
Comments

 U/S images reviewed.  Appropriate fetal growth is noted.   No
 fetal abnormalities are seen.  Unable to adequately evaluate
 fetal heart due to fetal position and maternal body habitus.
 Recommendations: 1) Fetal ECHO with Pediatric Cardiology
 2) Serial U/S every 4 weeks for fetal growth 2) Weekly BPP
 beginning @ 36 weeks
Recommendations

 1) Fetal ECHO with Pediatric Cardiology 2) Serial U/S every 4
 weeks for fetal growth 2) Weekly BPP beginning @ 36 weeks

              Aujla, Brennan

## 2019-10-13 IMAGING — CR DG CHEST 2V
2 series · 2 of 2 positions shown · non-contrast
Comparison: 12/06/2012

CLINICAL DATA: Shortness of breath, painful urination, 1 week post
vaginal delivery

EXAM:
CHEST - 2 VIEW

[chest pa]
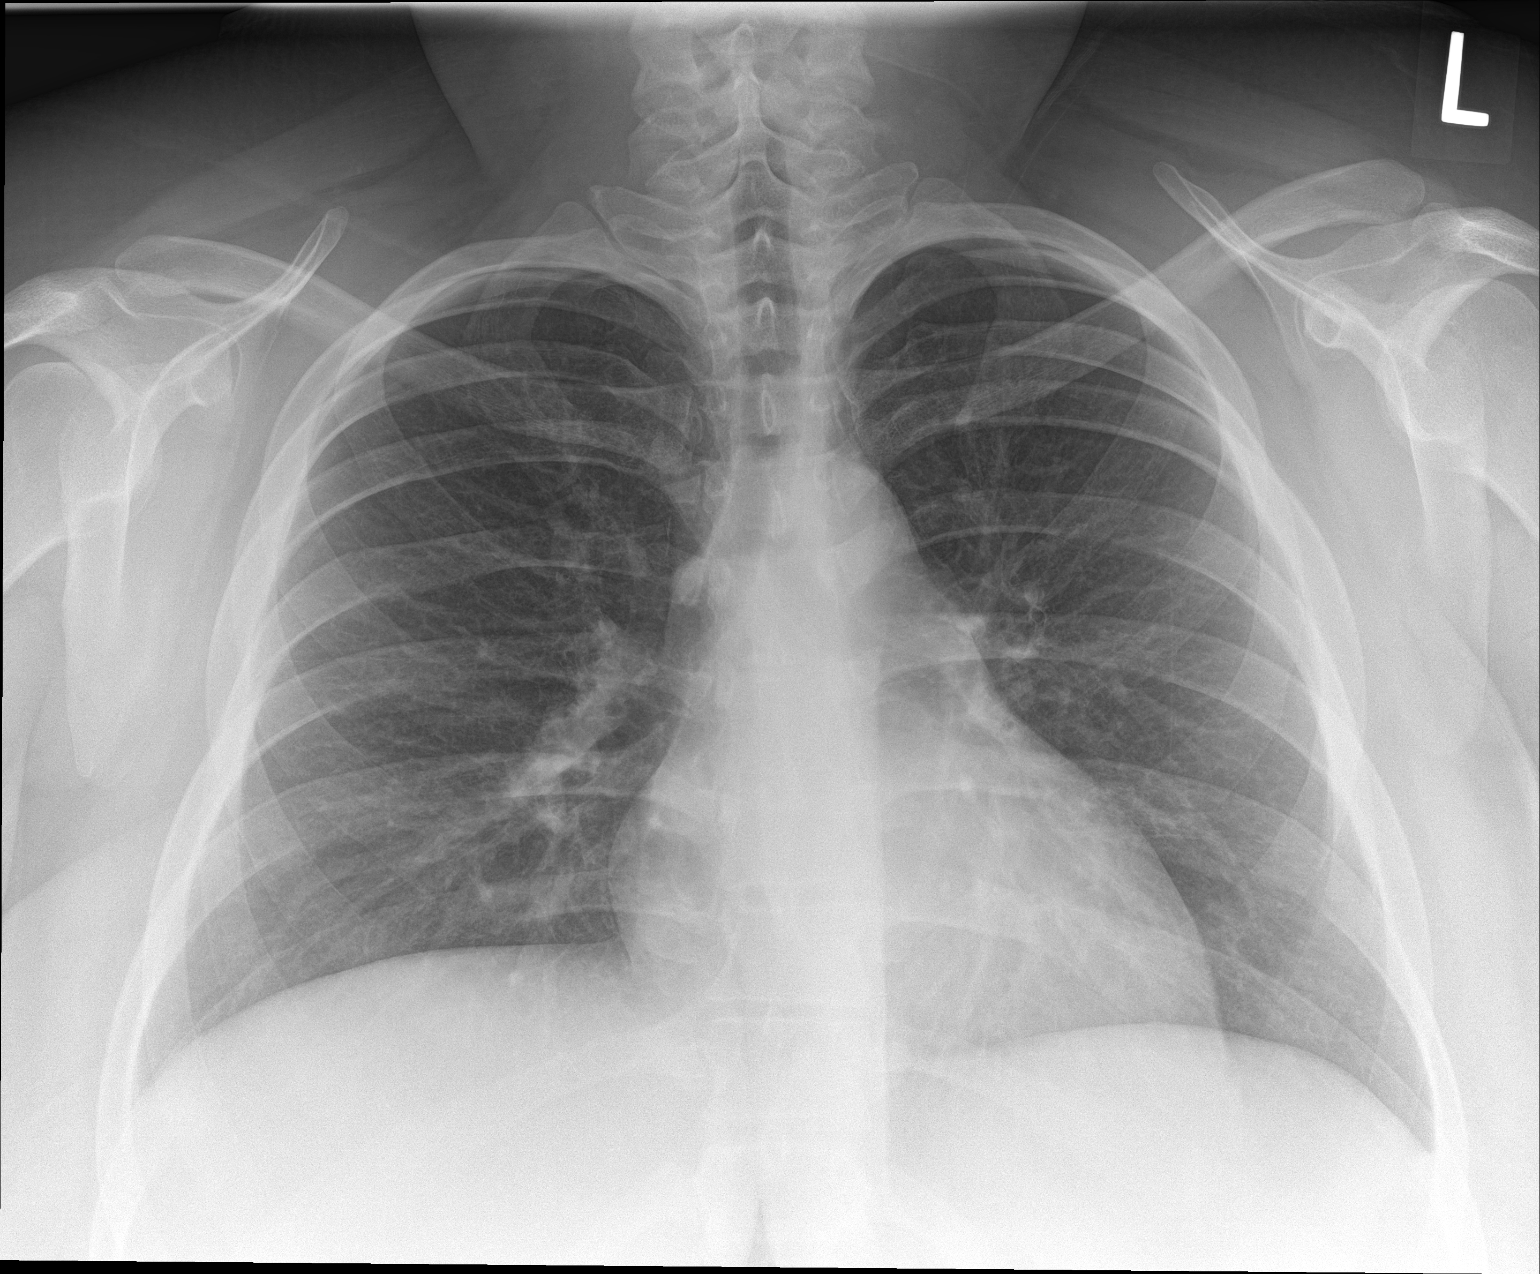

[chest lat]
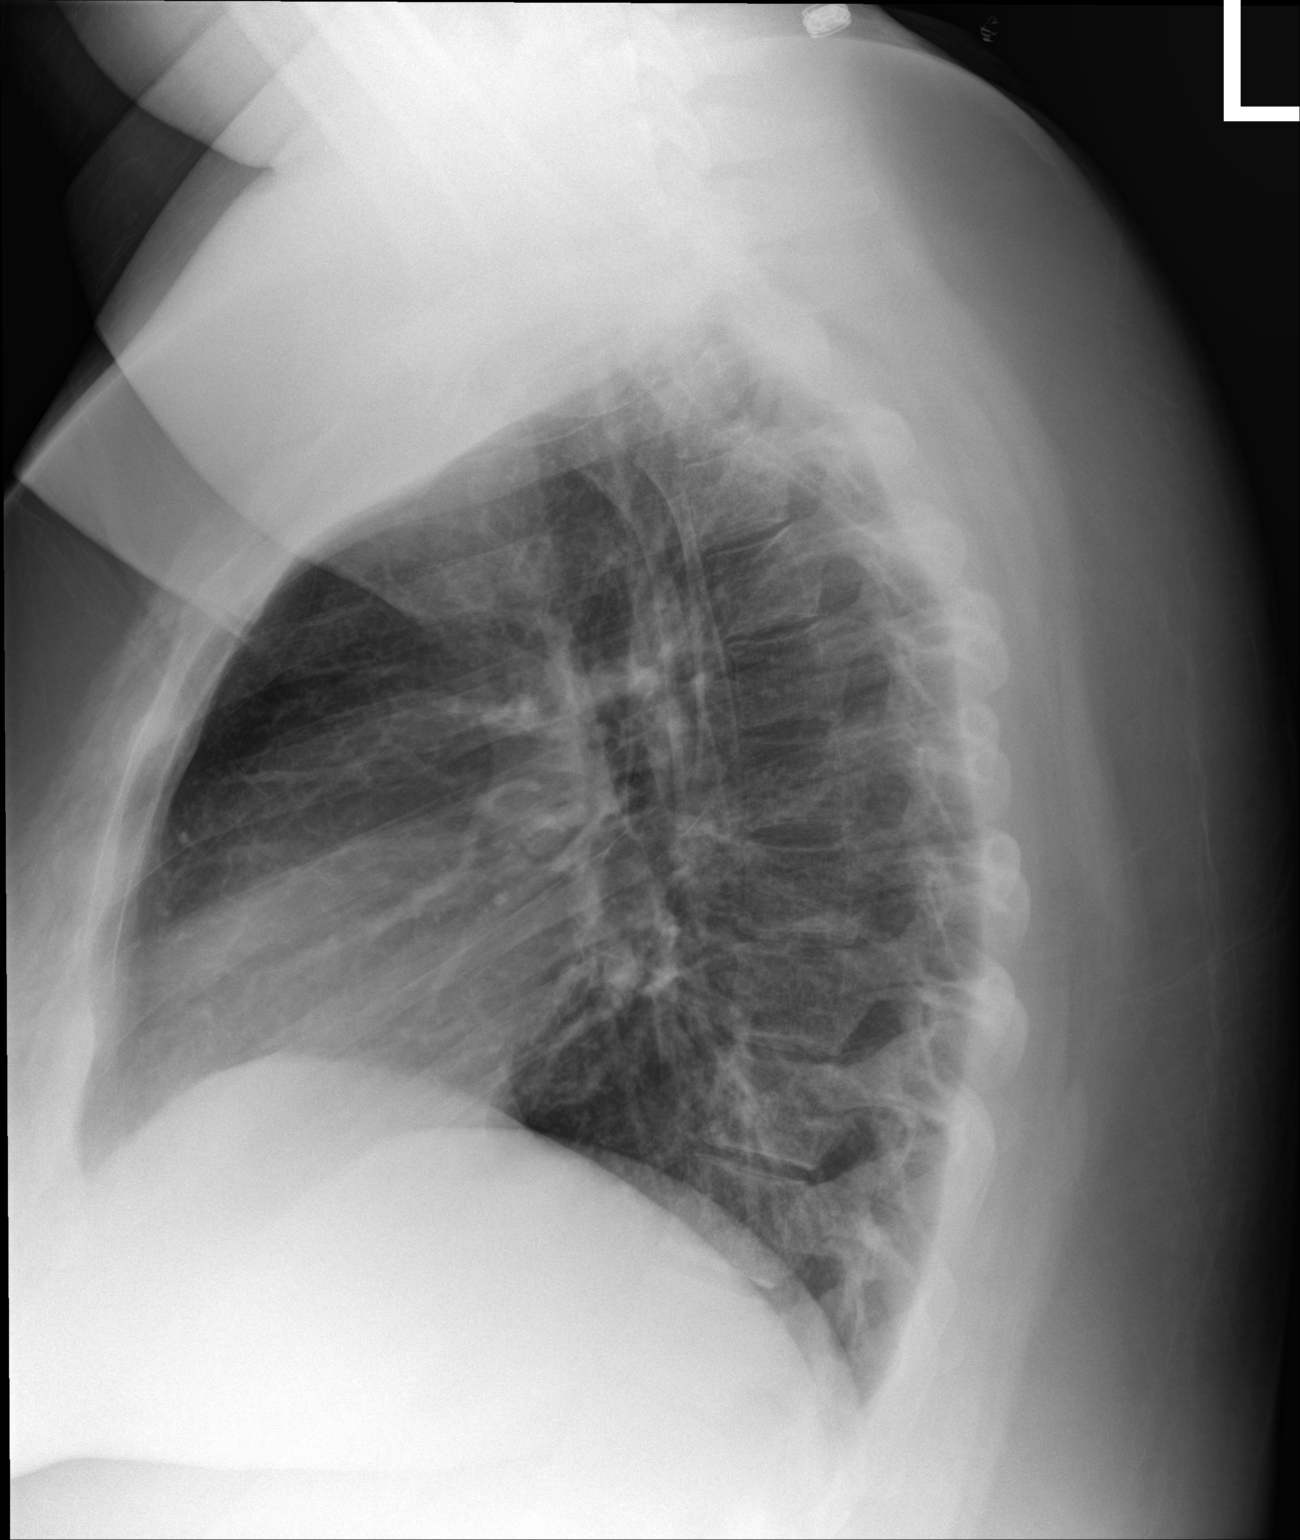

[2 of 2 positions shown; findings below may reference images not displayed]

FINDINGS: Normal heart size, mediastinal contours, and pulmonary vascularity.

Minimal chronic peribronchial thickening.

Lungs otherwise clear.

No infiltrate, pleural effusion, or pneumothorax.

Bones unremarkable.
IMPRESSION: No acute abnormalities.

## 2020-04-17 ENCOUNTER — Other Ambulatory Visit: Payer: Self-pay | Admitting: Obstetrics & Gynecology

## 2020-04-17 DIAGNOSIS — F53 Postpartum depression: Secondary | ICD-10-CM

## 2020-06-24 ENCOUNTER — Other Ambulatory Visit: Payer: Self-pay | Admitting: Obstetrics & Gynecology

## 2020-06-24 DIAGNOSIS — F53 Postpartum depression: Secondary | ICD-10-CM

## 2020-10-04 ENCOUNTER — Other Ambulatory Visit: Payer: Self-pay

## 2020-10-04 ENCOUNTER — Ambulatory Visit
Admission: EM | Admit: 2020-10-04 | Discharge: 2020-10-04 | Disposition: A | Discharge status: Home / Self Care | Payer: BC Managed Care – PPO | Attending: Physician Assistant | Admitting: Physician Assistant

## 2020-10-04 DIAGNOSIS — H0100B Unspecified blepharitis left eye, upper and lower eyelids: Secondary | ICD-10-CM | POA: Diagnosis not present

## 2020-10-04 DIAGNOSIS — H0100A Unspecified blepharitis right eye, upper and lower eyelids: Secondary | ICD-10-CM

## 2020-10-04 NOTE — ED Provider Notes (Signed)
[facility]    CSN: [id number] Arrival date & time: 10/04/20  1400      History   Chief Complaint Chief Complaint  Patient presents with   Eye Pain    HPI Chrysta Fulcher Alejo is a 24 y.o. female.   24 year old female comes in for 2 week history of swelling and pain to right upper and lower eyelid. Denies redness, warmth. Has had tearing. Denies eye redness, crusting. Has had blurry vision at times. Denies  URI symptoms, fever. Denies changes in hygiene product. Glasses use without contact lens use.     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 Authorizing Provider  diphenhydrAMINE-APAP, sleep, (TYLENOL PM EXTRA STRENGTH PO) Take 2 tablets by mouth at bedtime.     [provider]  ibuprofen (ADVIL,MOTRIN) 800 MG tablet Take 1 tablet (800 mg total) by mouth every 8 (eight) hours as needed. 01/05/19   Tamera Stands, DO  TRI-SPRINTEC 0.18/0.215/0.25 MG-35 MCG tablet Take 1 tablet by mouth once daily 04/19/20   Adam Phenix, MD    Family History Family History  Problem Relation Age of Onset   Hypertension Father    Diabetes Maternal Grandfather    Congestive Heart Failure Maternal Grandfather    Cancer Maternal Grandfather    Huntington's disease Paternal Grandfather      Social History Social History   Tobacco Use   Smoking status: Never Smoker   Smokeless tobacco: Never Used  Building services engineer Use: Never used  Substance Use Topics   Alcohol use: No   Drug use: Never     Allergies   Patient has no known allergies.   Review of Systems Review of Systems  Reason unable to perform ROS: See HPI as above.     Physical Exam Triage Vital Signs ED Triage Vitals  Enc Vitals Group     BP 10/04/20 1502 (!) 173/92     Pulse Rate 10/04/20 1502 95     Resp 10/04/20 1502 18     Temp 10/04/20 1502 98.1 F (36.7 C)     Temp Source 10/04/20 1502 Oral     SpO2 10/04/20 1502 96 %     Weight --      Height --      Head Circumference --      Peak Flow --      Pain Score 10/04/20 1506 6     Pain Loc --  Pain Edu? --      Excl. in GC? --    No data found.  Updated Vital Signs BP (!) 173/92 (BP Location: Left Arm)    Pulse 95    Temp 98.1 F (36.7 C) (Oral)    Resp 18    LMP 09/17/2020    SpO2 96%    Breastfeeding No   Visual Acuity Right Eye Distance: 20/30 Left Eye Distance: 20/30 Bilateral Distance: 20/30  Right Eye Near:   Left Eye Near:    Bilateral Near:     Physical Exam Constitutional:      General: She is not in acute distress.    Appearance: She is well-developed. She is not ill-appearing, toxic-appearing or diaphoretic.  HENT:     Head: Normocephalic and atraumatic.  Eyes:     Extraocular Movements: Extraocular movements intact.     Conjunctiva/sclera: Conjunctivae normal.     Pupils: Pupils are equal, round, and reactive to light.     Comments: Mild right upper and lower eyelid swelling without erythema, warmth. No hordeolum felt. Mild tenderness to palpation. Piercing to right temporal eyebrow without surrounding erythema, drainage, tenderness.  Neurological:     Mental Status: She is alert and oriented to person, place, and time.      UC Treatments / Results  Labs (all labs ordered are listed, but  only abnormal results are displayed) Labs Reviewed - No data to display  EKG   Radiology No results found.  Procedures Procedures (including critical care time)  Medications Ordered in UC Medications - No data to display  Initial Impression / Assessment and Plan / UC Course  I have reviewed the triage vital signs and the nursing notes.  Pertinent labs & imaging results that were available during my care of the patient were reviewed by me and considered in my medical decision making (see chart for details).    History and exam most consistent with blepharitis. Lid scrubs and warm compresses as directed. Patient to follow up with ophthalmology if symptoms worsens or does not improve. Return precautions given.   Final Clinical Impressions(s) / UC Diagnoses   Final diagnoses:  Blepharitis of upper and lower eyelids of both eyes, unspecified type    ED Prescriptions    None     PDMP not reviewed this encounter.   Belinda Fisher, PA-C 10/04/20 1537

## 2020-10-04 NOTE — ED Triage Notes (Signed)
Pt c/o swelling and pain to rt upper eye lid x2wks. States has blurred vision at times.

## 2020-10-04 NOTE — Discharge Instructions (Signed)
Lid scrubs and warm compresses as directed. Monitor for any worsening of symptoms, changes in vision, sensitivity to light, eye swelling, painful eye movement, follow up with ophthalmology for further evaluation.   

## 2020-10-10 DIAGNOSIS — H5213 Myopia, bilateral: Secondary | ICD-10-CM | POA: Insufficient documentation

## 2021-02-24 DIAGNOSIS — R0683 Snoring: Secondary | ICD-10-CM | POA: Insufficient documentation

## 2022-07-14 DIAGNOSIS — F419 Anxiety disorder, unspecified: Secondary | ICD-10-CM | POA: Insufficient documentation

## 2022-11-11 DIAGNOSIS — Z9884 Bariatric surgery status: Secondary | ICD-10-CM | POA: Insufficient documentation

## 2023-08-17 DIAGNOSIS — M5412 Radiculopathy, cervical region: Secondary | ICD-10-CM | POA: Insufficient documentation

## 2023-08-25 ENCOUNTER — Encounter: Payer: Self-pay | Admitting: *Deleted

## 2023-08-25 ENCOUNTER — Other Ambulatory Visit: Payer: Self-pay | Admitting: *Deleted

## 2023-09-09 ENCOUNTER — Encounter: Payer: BC Managed Care – PPO | Admitting: Obstetrics and Gynecology

## 2023-10-13 HISTORY — PX: BARIATRIC SURGERY: SHX1103

## 2024-09-22 ENCOUNTER — Emergency Department (HOSPITAL_COMMUNITY)
Admission: EM | Admit: 2024-09-22 | Discharge: 2024-09-22 | Disposition: A | Discharge status: Home / Self Care | Source: Home / Self Care

## 2024-09-22 NOTE — ED Provider Notes (Signed)
 Patient placed in First Look pathway, seen and evaluated for chief complaint of abdominal pain.  Pt endorses a few days of lower abdominal cramping and dysuria.  Pertinent exam findings include NAD.   Patient/parent counseled on process, plan, and necessity for staying for completing the evaluation.    Note By: Peyton Gammons, PA-C 7:58 PM   [facility] Emergency Department Emergency Department Provider Note  This document was created using the aid of voice recognition Dragon dictation software.   Provider at bedside: 09/23/2024 8:15 PM   History obtained from the: Patient  History   Chief Complaint  Patient presents with  . Abdominal Cramping    HPI  Kelly Lam is a 28 y.o. female who presents to the ED with complaints of abdominal pain.  Patient states for the last 5 days she has been having intermittent lower abdominal pain that is described as a cramping sensation.  Denies any known alleviating or exacerbating symptoms.  She also notes associate symptoms of intermittent headaches, nausea, and pain with urination.  Patient also mention having 1 episode yesterday of vaginal spotting while wiping but states this has not continue and she denies any bleeding today.  Reported last menstrual cycle was 8/23 and reports her cycles are typically irregular however this is the longest she has gone without a menstrual cycle.  States she has not taken any pregnancy test and she is not on any form of contraceptive.  Patient denies any fever, chest pain, shortness of breath, vaginal discharge, back pain, dizziness, vision changes, vomiting, history of kidney stone.   ______________________ ROS: Pertinent positives and negatives per HPI. Pertinent past medical, surgical, social and family history records were reviewed. Current Medications and Allergies were reviewed.  Physical Exam   Vitals:   09/22/24 1953 09/22/24 2317  BP: 115/78 114/72  BP Location: Right arm  Right arm  Patient Position: Sitting Sitting  Pulse: 73 96  Resp: 20 20  Temp: 98.2 F (36.8 C) 99 F (37.2 C)  TempSrc: Oral Oral  SpO2: 100% 100%  Weight: 117 kg (258 lb 9.6 oz)   Height: 172.7 cm (5' 8)     Physical Exam Vitals reviewed.  Constitutional:      General: She is not in acute distress.    Appearance: Normal appearance. She is not ill-appearing.  HENT:     Head: Normocephalic and atraumatic.     Nose: Nose normal.  Eyes:     Conjunctiva/sclera: Conjunctivae normal.  Cardiovascular:     Pulses: Normal pulses.  Pulmonary:     Effort: Pulmonary effort is normal.  Musculoskeletal:        General: Normal range of motion.     Cervical back: Normal range of motion.  Neurological:     Mental Status: She is alert. Mental status is at baseline.     Results  EKG Impression:  (Interpreted by me)  ED Course     Clinical Complexity  Patient's presentation is most consistent with acute, uncomplicated illness.  Patient's Impaired access to primary care increases the complexity of managing their  presentation with abdominal pain.    Provider time spent in patient care today, inclusive of but not limited to clinical reassessment, review of diagnostic studies, and discharge preparation, was less than 30 minutes.   Procedure Note  Procedures  Medical Decision Making  Medical Decision Making 28 year old female presents to the ED with complaints of abdominal cramping.  Upon initial evaluation patient is sitting comfortably in  no acute distress and nontoxic-appearing.  Vital signs are within normal range and no evidence of fever.  Physical exam was significant for fine noted blood.  Labs were significant for positive hCG with a elevated quantitative hCG of 56K.  No evidence of leukocytosis or anemia.  Mild decreased creatinine with normal BUN and GFR.  Urinalysis shows no evidence of blood, leukocyte Estrace, or nitrates.  Low suspicion for UTI given reassuring  urinalysis.  Patient's symptoms are likely related to pregnancy.  Given that patient is above the discriminatory zone and has not had an ultrasound performed and reports intermittent abdominal pain a ultrasound will be obtained to obtain location of pregnancy.  Ultrasound was significant for evidence of early intrauterine pregnancy with a positive fetal pole, gestational sac and yolk sac but no evidence of cardiac activity.  No evidence of ectopic pregnancy given these findings.  Given that patient has no alarming finding, denies any active bleeding, is hemodynamically stable, and overall well-appearing she is stable and appropriate to be discharged to outpatient OB follow-up and close monitoring.  Return precautions were advised.  Patient understands and agree with the plan.  Problems Addressed: Less than [redacted] weeks gestation of pregnancy (CMD): chronic illness or injury Lower abdominal pain: chronic illness or injury  Amount and/or Complexity of Data Reviewed Labs: ordered. Radiology: ordered.    Brief Overview Kelly Lam is a 28 y.o. female who presents with complaint as per above.  I have reviewed the nursing documentation for past medical history, family history, and social history and agree.  Initial Differential Diagnoses: The differential include DDX: threatened abortion, incomplete abortion, complete abortion, ectopic pregnancy, pregnancy pain, UTI.  Therapies: These medications and interventions were provided for the patient while in the ED.  Testing Results: Labs significant for : Increased HCG level.   My interpretation of Imaging is significant for: IUP on US . All imaging was interpreted by radiologist and interpretation was reviewed by me.   All imaging studies and laboratory data were personally reviewed by me and incorporated into my medical decision making.  See the EMR for full details regarding lab and imaging results. Discrepancies: I reviewed the  available nursing and EMS notes. There were the following discrepancies which I have clarified with the patient: None  Other: Other Documentation: I reviewed the patient's past medical history, including notes from our facility and what is available in CareEverywhere.  Consideration for hospitalization: yes  Final Differential: The patient's presentation is most consistent with less than [redacted] week gestation of pregnancy.   Post-ED Care: I believe that the patient is safe for discharge home with outpatient followup. We discussed following up OB. I provided thorough ED return precautions. The patient feels safe and comfortable with this plan.  Discussed findings, test results and plan of care with patient. Patient in agreement to plan of care. All questions were answered to the patient's satisfaction. Patient will be discharged with follow up to [facility] or return to the ED if symptoms persist, worsen or change.  ED Clinical Impression  Clinical picture was discussed with patient along with risks and benefits of management options, shared decision making will discharge the patient home with close outpatient follow-up for continued management.  1. Less than [redacted] weeks gestation of pregnancy (CMD)   2. Lower abdominal pain    FOLLOW UP Larraine Macario Leopard, DO [address] [place] [address] [phone]      ED Disposition     ED Disposition  Discharge   Condition  Stable   Comment  --        _____________________________

## 2024-10-11 ENCOUNTER — Other Ambulatory Visit (INDEPENDENT_AMBULATORY_CARE_PROVIDER_SITE_OTHER): Payer: Self-pay

## 2024-10-11 ENCOUNTER — Ambulatory Visit (INDEPENDENT_AMBULATORY_CARE_PROVIDER_SITE_OTHER): Admitting: *Deleted

## 2024-10-11 VITALS — BP 104/70 | HR 76 | Wt 260.4 lb

## 2024-10-11 DIAGNOSIS — Z3A08 8 weeks gestation of pregnancy: Secondary | ICD-10-CM | POA: Diagnosis not present

## 2024-10-11 DIAGNOSIS — Z3480 Encounter for supervision of other normal pregnancy, unspecified trimester: Secondary | ICD-10-CM

## 2024-10-11 DIAGNOSIS — O3680X0 Pregnancy with inconclusive fetal viability, not applicable or unspecified: Secondary | ICD-10-CM

## 2024-10-11 DIAGNOSIS — I10 Essential (primary) hypertension: Secondary | ICD-10-CM | POA: Insufficient documentation

## 2024-10-11 DIAGNOSIS — Z3481 Encounter for supervision of other normal pregnancy, first trimester: Secondary | ICD-10-CM

## 2024-10-11 MED ORDER — PROMETHAZINE HCL 25 MG PO TABS: 25.0000 mg | ORAL_TABLET | Freq: Four times a day (QID) | ORAL | 1 refills | Status: AC | PRN

## 2024-10-11 MED ORDER — DOXYLAMINE-PYRIDOXINE 10-10 MG PO TBEC: 2.0000 | DELAYED_RELEASE_TABLET | Freq: Every day | ORAL | 5 refills | Status: AC

## 2024-10-11 NOTE — Patient Instructions (Signed)

## 2024-10-11 NOTE — Progress Notes (Signed)
 New OB Intake  I connected with Kelly Lam  on 10/11/24 at 10:15 AM EDT by In Person Visit and verified that I am speaking with the correct person using two identifiers. Nurse is located at [facility] and pt is located at [place].  I discussed the limitations, risks, security and privacy concerns of performing an evaluation and management service by telephone and the availability of in person appointments. I also discussed with the patient that there may be a patient responsible charge related to this service. The patient expressed understanding and agreed to proceed.  I explained I am completing New OB Intake today. We discussed EDD of 05/19/25 based on LMP of 08/12/24. Pt is G3P1001. I reviewed her allergies, medications and Medical/Surgical/OB history.    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 Discussed the nature of our practice with multiple providers including residents and students as well as female and female providers. Due to the size of the practice, the delivering provider may not be the same as those providing prenatal care.   Patient is interested in water birth.  MyChart/Babyscripts MyChart access verified. I explained pt will have some visits in office and some virtually. Babyscripts instructions given and order placed. Patient verifies receipt of registration text/e-mail. Account successfully created and app downloaded. If patient is a candidate for Optimized scheduling, add to sticky note.   Blood Pressure Cuff/Weight Scale Has BP cuff at home. Explained after first prenatal appt pt will check weekly and document in Babyscripts. Patient does not have weight scale; patient may purchase if they desire to track weight weekly in Babyscripts.  Anatomy US  Explained first scheduled US  will be around 19 weeks. Anatomy US  scheduled for  TBD at TBD.  Is patient a candidate for Babyscripts Optimization? No, due to Risk Factors   First visit review I reviewed new OB appt with patient. Explained pt will be seen by Nidia Daring, NP at first visit. Discussed [place] genetic screening with patient. Requests Panorama Routine prenatal labs OB Urine collected at today's visit.   Last Pap No results found for: DIAGPAP  Rocky CHRISTELLA Ober, RN 10/11/2024  10:23 AM

## 2024-10-13 LAB — CULTURE, OB URINE

## 2024-10-13 LAB — URINE CULTURE, OB REFLEX

## 2024-10-21 ENCOUNTER — Inpatient Hospital Stay (HOSPITAL_COMMUNITY)
Admission: AD | Admit: 2024-10-21 | Discharge: 2024-10-21 | Disposition: A | Discharge status: Home / Self Care | Attending: Obstetrics and Gynecology | Admitting: Obstetrics and Gynecology

## 2024-10-21 ENCOUNTER — Other Ambulatory Visit: Payer: Self-pay

## 2024-10-21 DIAGNOSIS — N939 Abnormal uterine and vaginal bleeding, unspecified: Secondary | ICD-10-CM

## 2024-10-21 DIAGNOSIS — O26851 Spotting complicating pregnancy, first trimester: Secondary | ICD-10-CM | POA: Diagnosis present

## 2024-10-21 DIAGNOSIS — M79661 Pain in right lower leg: Secondary | ICD-10-CM

## 2024-10-21 DIAGNOSIS — Z3A01 Less than 8 weeks gestation of pregnancy: Secondary | ICD-10-CM | POA: Insufficient documentation

## 2024-10-21 DIAGNOSIS — M79662 Pain in left lower leg: Secondary | ICD-10-CM | POA: Diagnosis not present

## 2024-10-21 DIAGNOSIS — O99891 Other specified diseases and conditions complicating pregnancy: Secondary | ICD-10-CM | POA: Insufficient documentation

## 2024-10-21 DIAGNOSIS — Z3A10 10 weeks gestation of pregnancy: Secondary | ICD-10-CM | POA: Diagnosis not present

## 2024-10-21 DIAGNOSIS — O26891 Other specified pregnancy related conditions, first trimester: Secondary | ICD-10-CM

## 2024-10-21 DIAGNOSIS — Z3491 Encounter for supervision of normal pregnancy, unspecified, first trimester: Secondary | ICD-10-CM

## 2024-10-21 LAB — URINALYSIS, ROUTINE W REFLEX MICROSCOPIC
Bilirubin Urine: NEGATIVE
Glucose, UA: NEGATIVE mg/dL
Hgb urine dipstick: NEGATIVE
Ketones, ur: NEGATIVE mg/dL
Nitrite: NEGATIVE
Protein, ur: NEGATIVE mg/dL
Specific Gravity, Urine: 1.021 (ref 1.005–1.030)
pH: 6 (ref 5.0–8.0)

## 2024-10-21 LAB — WET PREP, GENITAL
Clue Cells Wet Prep HPF POC: NONE SEEN
Sperm: NONE SEEN
Trich, Wet Prep: NONE SEEN
WBC, Wet Prep HPF POC: >=10 — AB (ref <10)
Yeast Wet Prep HPF POC: NONE SEEN

## 2024-10-21 MED ORDER — ENOXAPARIN SODIUM 120 MG/0.8ML IJ SOSY
1.0000 mg/kg | PREFILLED_SYRINGE | Freq: Once | INTRAMUSCULAR | Status: AC
  Administered 2024-10-21: 120 mg via SUBCUTANEOUS
  Filled 2024-10-21: qty 0.8

## 2024-10-21 MED ORDER — ENOXAPARIN SODIUM 40 MG/0.4ML IJ SOSY: 40.0000 mg | PREFILLED_SYRINGE | Freq: Once | INTRAMUSCULAR | Status: DC

## 2024-10-21 NOTE — MAU Note (Signed)
 Kelly Lam is a 28 y.o. at [redacted]w[redacted]d here in MAU reporting: she's having abdominal cramping and spotting, spotting with wiping began yesterday and cramping began two days ago.  Denies recent intercourse.  Also states having pain in bilateral calves, more on the right.  States calf pain is a constant, dull, burning ache, not cramping.  LMP: 08/12/2024 Onset of complaint: 2 days ago Pain score: 7 abdomen & 5 calves Vitals:   10/21/24 1531  BP: (!) 102/55  Pulse: 81  Resp: 18  Temp: 98.7 F (37.1 C)  SpO2: 98%     FHT: attempted to doppler, nothing heard.  Lab orders placed from triage: UA

## 2024-10-21 NOTE — MAU Provider Note (Signed)
 S Ms. Kelly Lam is a 28 y.o. [phone] patient who presents to MAU today with complaint of mild lower abdominal cramping with spotting after using the bathroom only when wiping.  Patient states that it has only been spots of blood that she sees on the toilet paper.  She denies any recent intercourse, vaginal discharge, leaking of fluid, vaginal itching, burning, or any urinary discomforts.  Patient also reports that she has had bilateral lower calf pain that has been constant dull, burning with an aching pain.  She denies any redness to the area or warmth to touch.  She is able to ambulate without difficulty and denies any history of DVTs for herself or family history that she is aware of.   Prenatal care at [facility] and available records reviewed.  Her pregnancy is complicated by morbid obesity, ADHD, essential hypertension, insulin resistance, status post laparoscopic sleeve gastrectomy, suspected sleep apnea   O BP (!) 102/55 (BP Location: Right Arm)   Pulse 81   Temp 98.7 F (37.1 C) (Oral)   Resp 18   Ht 5' 8 (1.727 m)   Wt 119.9 kg   LMP 08/12/2024 (Exact Date)   SpO2 98%   BMI 40.19 kg/m  Physical Exam Vitals and nursing note reviewed.  Constitutional:      General: She is not in acute distress.    Appearance: She is well-developed. She is obese. She is not ill-appearing.  HENT:     Head: Normocephalic.     Mouth/Throat:     Mouth: Mucous membranes are moist.  Cardiovascular:     Rate and Rhythm: Normal rate.  Pulmonary:     Effort: Pulmonary effort is normal.  Abdominal:     Palpations: Abdomen is soft.     Tenderness: There is no abdominal tenderness.  Musculoskeletal:        General: Normal range of motion.     Cervical back: Normal range of motion.     Right lower leg: Tenderness present. No swelling. No edema.     Left lower leg: Tenderness present. No swelling. No edema.     Comments: Patient complaining of bilateral lower calf pain for the past 2 days.  She  denies any redness, swelling, heat and states it feels similar to charley horses but this is a new symptom  this pregnancy and she is concerned   Skin:    General: Skin is warm.  Neurological:     Mental Status: She is oriented to person, place, and time.  Psychiatric:        Behavior: Behavior normal.           MDM  HIGH  Prenatal chart reviewed Physical exam performed Vaginal swabs collected : Wet prep with no evidence of infection, GC pending at discharge UA: No evidence of UTI Bedside ultrasound performed: ( SIUP , FHR  visualized at 164 bpm) See images in media and above   Bilateral lower extremity Dopplers ordered (due to the weekend and after 5 PM, protocol was followed for obtaining imaging of venous Dopplers in the morning and 1 dose of Lovenox was given prior to the patient leaving) r/o DVT   Differential diagnosis considered for 1st trimester vaginal bleeding includes but is not limited to: ectopic pregnancy, complete spontaneous abortion, incomplete abortion, missed abortion, threatened abortion, embryonic/fetal demise, cervical insufficiency, cervical or vaginal disorder     Orders Placed This Encounter  Procedures   Wet prep, genital    Standing Status:  Standing    Number of Occurrences:   1   Urinalysis, Routine w reflex microscopic -Urine, Clean Catch    Standing Status:   Standing    Number of Occurrences:   1    Specimen Source:   Urine, Clean Catch [76]   Discharge patient Discharge disposition: 01-Home or Self Care; Discharge patient date: 10/21/2024    Standing Status:   Standing    Number of Occurrences:   1    Discharge disposition:   01-Home or Self Care [1]    Discharge patient date:   10/21/2024      Results for orders placed or performed during the hospital encounter of 10/21/24 (from the past 24 hours)  Urinalysis, Routine w reflex microscopic -Urine, Clean Catch     Status: Abnormal   Collection Time: 10/21/24  3:50 PM  Result Value  Ref Range   Color, Urine YELLOW YELLOW   APPearance CLOUDY (A) CLEAR   Specific Gravity, Urine 1.021 1.005 - 1.030   pH 6.0 5.0 - 8.0   Glucose, UA NEGATIVE NEGATIVE mg/dL   Hgb urine dipstick NEGATIVE NEGATIVE   Bilirubin Urine NEGATIVE NEGATIVE   Ketones, ur NEGATIVE NEGATIVE mg/dL   Protein, ur NEGATIVE NEGATIVE mg/dL   Nitrite NEGATIVE NEGATIVE   Leukocytes,Ua TRACE (A) NEGATIVE   RBC / HPF 0-5 0 - 5 RBC/hpf   WBC, UA 6-10 0 - 5 WBC/hpf   Bacteria, UA RARE (A) NONE SEEN   Squamous Epithelial / HPF 0-5 0 - 5 /HPF   Mucus PRESENT   Wet prep, genital     Status: Abnormal   Collection Time: 10/21/24  4:28 PM   Specimen: PATH Cytology Cervicovaginal Ancillary Only  Result Value Ref Range   Yeast Wet Prep HPF POC NONE SEEN NONE SEEN   Trich, Wet Prep NONE SEEN NONE SEEN   Clue Cells Wet Prep HPF POC NONE SEEN NONE SEEN   WBC, Wet Prep HPF POC >=10 (A) <10   Sperm NONE SEEN      Pt informed that the ultrasound is considered a limited OB ultrasound and is not intended to be a complete ultrasound exam.  Patient also informed that the ultrasound is not being completed with the intent of assessing for fetal or placental anomalies or any pelvic abnormalities.  Explained that the purpose of today's ultrasound is to assess for  FHR and maternal reassurance .  Patient acknowledges the purpose of the exam and the limitations of the study.       I have reviewed the patient chart and performed the physical exam . I have ordered & interpreted the lab results and reviewed and interpreted the BSUS Images  Medications ordered as stated above.  A/P as described below.  Counseling and education provided and patient agreeable  with plan as described below. Verbalized understanding.    ASSESSMENT Medical screening exam complete  Vaginal spotting  Bilateral calf pain  [redacted] weeks gestation of pregnancy  Normal intrauterine pregnancy on prenatal ultrasound in first trimester   Meds ordered this  encounter  Medications   DISCONTD: enoxaparin (LOVENOX) injection 40 mg   enoxaparin (LOVENOX) injection 120 mg     PLAN Future Appointments  Date Time Provider Department Center  11/09/2024  9:55 AM Delores Nidia CROME, FNP [facility] None  01/01/2025 10:00 AM [facility] PROVIDER 1 [facility]  01/01/2025 10:30 AM [facility] US1 [facility]    Discharge from MAU in stable condition  Return in AM as discussed for  B/L Venous dopplers of lower extremities  See AVS for full description of educational information and instructions provided to the patient at time of discharge  Warning signs for worsening condition that would warrant emergency follow-up discussed  Patient may return to MAU as needed   Littie Olam LABOR, NP 10/21/2024 6:27 PM   This chart was dictated using voice recognition software, Dragon. Despite the best efforts of this provider to proofread and correct errors, errors may still occur which can change documentation meaning.

## 2024-10-22 ENCOUNTER — Ambulatory Visit (HOSPITAL_COMMUNITY)
Admission: RE | Admit: 2024-10-22 | Discharge: 2024-10-22 | Disposition: A | Discharge status: Home / Self Care | Source: Ambulatory Visit | Attending: Nurse Practitioner | Admitting: Nurse Practitioner

## 2024-10-22 DIAGNOSIS — M79605 Pain in left leg: Secondary | ICD-10-CM | POA: Diagnosis not present

## 2024-10-22 DIAGNOSIS — M79662 Pain in left lower leg: Secondary | ICD-10-CM

## 2024-10-22 DIAGNOSIS — M79661 Pain in right lower leg: Secondary | ICD-10-CM

## 2024-10-22 DIAGNOSIS — M79604 Pain in right leg: Secondary | ICD-10-CM | POA: Insufficient documentation

## 2024-10-22 DIAGNOSIS — N939 Abnormal uterine and vaginal bleeding, unspecified: Secondary | ICD-10-CM | POA: Diagnosis present

## 2024-10-22 DIAGNOSIS — Z3A10 10 weeks gestation of pregnancy: Secondary | ICD-10-CM | POA: Diagnosis not present

## 2024-10-22 DIAGNOSIS — Z3491 Encounter for supervision of normal pregnancy, unspecified, first trimester: Secondary | ICD-10-CM | POA: Insufficient documentation

## 2024-10-22 NOTE — Progress Notes (Signed)
 VASCULAR LAB    Bilateral lower extremity venous duplex has been performed.  See CV proc for preliminary results.   Lexie Koehl, RVT 10/22/2024, 12:35 PM

## 2024-10-23 LAB — GC/CHLAMYDIA PROBE AMP (~~LOC~~) NOT AT ARMC
Chlamydia: NEGATIVE
Comment: NEGATIVE
Comment: NORMAL
Neisseria Gonorrhea: NEGATIVE

## 2024-10-24 ENCOUNTER — Ambulatory Visit: Payer: Self-pay | Admitting: Nurse Practitioner

## 2024-10-26 ENCOUNTER — Encounter: Payer: Self-pay | Admitting: *Deleted

## 2024-11-09 ENCOUNTER — Ambulatory Visit (INDEPENDENT_AMBULATORY_CARE_PROVIDER_SITE_OTHER): Admitting: Certified Nurse Midwife

## 2024-11-09 ENCOUNTER — Encounter: Payer: Self-pay | Admitting: Certified Nurse Midwife

## 2024-11-09 ENCOUNTER — Other Ambulatory Visit (HOSPITAL_COMMUNITY)
Admission: RE | Admit: 2024-11-09 | Discharge: 2024-11-09 | Disposition: A | Discharge status: Home / Self Care | Source: Ambulatory Visit | Attending: Obstetrics and Gynecology | Admitting: Obstetrics and Gynecology

## 2024-11-09 VITALS — BP 116/68 | HR 84 | Wt 259.0 lb

## 2024-11-09 DIAGNOSIS — Z3492 Encounter for supervision of normal pregnancy, unspecified, second trimester: Secondary | ICD-10-CM

## 2024-11-09 DIAGNOSIS — Z3A12 12 weeks gestation of pregnancy: Secondary | ICD-10-CM | POA: Diagnosis not present

## 2024-11-09 DIAGNOSIS — Z3481 Encounter for supervision of other normal pregnancy, first trimester: Secondary | ICD-10-CM

## 2024-11-09 DIAGNOSIS — R309 Painful micturition, unspecified: Secondary | ICD-10-CM

## 2024-11-09 DIAGNOSIS — Z8759 Personal history of other complications of pregnancy, childbirth and the puerperium: Secondary | ICD-10-CM

## 2024-11-09 NOTE — Patient Instructions (Signed)
   Considering Waterbirth? Guide for patients at Center for [facility]) Why consider waterbirth? Gentle birth for babies  Less pain medicine used in labor  May allow for passive descent/less pushing  May reduce perineal tears  More mobility and instinctive maternal position changes  Increased maternal relaxation   Is waterbirth safe? What are the risks of infection, drowning or other complications? Infection:  Very low risk (3.7 % for tub vs 4.8% for bed)  7 in 8000 waterbirths with documented infection  Poorly cleaned equipment most common cause  Slightly lower group B strep transmission rate  Drowning  Maternal:  Very low risk  Related to seizures or fainting  Newborn:  Very low risk. No evidence of increased risk of respiratory problems in multiple large studies  Physiological protection from breathing under water  Avoid underwater birth if there are any fetal complications  Once baby's head is out of the water, keep it out.  Birth complication  Some reports of cord trauma, but risk decreased by bringing baby to surface gradually  No evidence of increased risk of shoulder dystocia. Mothers can usually change positions faster in water than in a bed, possibly aiding the maneuvers to free the shoulder.   There are 2 things you MUST do to have a waterbirth with [facility]: Attend a waterbirth class at [facility] at [facility]   3rd Wednesday of every month from 7-9 pm (virtual during COVID) [employer] at [email or web address] or [email or web address] or by calling [phone] Bring us  the certificate from the class to your prenatal appointment or send via MyChart Meet with a midwife at 36 weeks* to see if you can still plan a waterbirth and to sign the consent.   *We also recommend that you schedule as many of your prenatal visits with a midwife as possible.    Helpful information: You may want to bring a bathing suit top to the hospital  to wear during labor but this is optional.  All other supplies are provided by the hospital. Please arrive at the hospital with signs of active labor, and do not wait at home until late in labor. It takes 45 min- 1 hour for fetal monitoring, and check in to your room to take place, plus transport and filling of the waterbirth tub.    Things that would prevent you from having a waterbirth: Premature, <37wks  Previous cesarean birth  Presence of thick meconium-stained fluid  Multiple gestation (Twins, triplets, etc.)  Uncontrolled diabetes or gestational diabetes requiring medication  Hypertension diagnosed in pregnancy or preexisting hypertension (gestational hypertension, preeclampsia, or chronic hypertension) Fetal growth restriction (your baby measures less than 10th percentile on ultrasound) Heavy vaginal bleeding  Non-reassuring fetal heart rate  Active infection (MRSA, etc.). Group B Strep is NOT a contraindication for waterbirth.  If your labor has to be induced and induction method requires continuous monitoring of the baby's heart rate  Other risks/issues identified by your obstetrical provider   Please remember that birth is unpredictable. Under certain unforeseeable circumstances your provider may advise against giving birth in the tub. These decisions will be made on a case-by-case basis and with the safety of you and your baby as our highest priority.    Updated 03/25/22

## 2024-11-09 NOTE — Progress Notes (Signed)
 Pt presents for NOB visit. Denies PAP today. Pt c/o burning with urination

## 2024-11-09 NOTE — Progress Notes (Signed)
 INITIAL PRENATAL VISIT  History:  Kelly Lam is a 28 y.o. G3P1011 at [redacted]w[redacted]d by LMP being seen today for her first obstetrical visit.  Her obstetrical history is significant for obesity. Patient does intend to breast feed. Pregnancy history fully reviewed.  Patient reports no complaints.  HISTORY: OB History  Gravida Para Term Preterm AB Living  3 1 1  0 1 1  SAB IAB Ectopic Multiple Live Births  0 1 0 0 1    # Outcome Date GA Lbr Len/2nd Weight Sex Type Anes PTL Lv  3 Current           2 IAB 2024     TAB     1 Term 01/03/19 [redacted]w[redacted]d 05:10 / 01:43 6 lb 15.5 oz (3.16 kg) M Vag-Spont EPI  LIV     Name: Dalsanto,BOY Clarke     Apgar1: 8  Apgar5: 9    Patient reports unsure of last pap smear but declines one today. Request to be completed in PP.   Past Medical History:  Diagnosis Date   Gestational hypertension 12/01/2018   Morbid obesity with BMI of 50.0-59.9, adult (HCC)    Pregnancy induced hypertension    Urinary tract infection    Past Surgical History:  Procedure Laterality Date   BARIATRIC SURGERY  10/13/2023   bariatric sleeve   HERNIA REPAIR     TONSILLECTOMY     WISDOM TOOTH EXTRACTION     Family History  Problem Relation Age of Onset   Hypertension Father    Diabetes Maternal Grandfather    Congestive Heart Failure Maternal Grandfather    Cancer Maternal Grandfather    Huntington's disease Paternal Grandfather    Social History   Tobacco Use   Smoking status: Never   Smokeless tobacco: Never  Vaping Use   Vaping status: Never Used  Substance Use Topics   Alcohol use: No   Drug use: Never   No Known Allergies Current Outpatient Medications on File Prior to Visit  Medication Sig Dispense Refill   acetaminophen  (TYLENOL ) 325 MG tablet Take 650 mg by mouth every 6 (six) hours as needed for mild pain (pain score 1-3) or moderate pain (pain score 4-6).     Doxylamine -Pyridoxine  (DICLEGIS ) 10-10 MG TBEC Take 2 tablets by mouth at bedtime. If symptoms persist,  add one tablet in the morning and one in the afternoon 100 tablet 5   escitalopram (LEXAPRO) 20 MG tablet Take 20 mg by mouth daily.     promethazine  (PHENERGAN ) 25 MG tablet Take 1 tablet (25 mg total) by mouth every 6 (six) hours as needed for nausea or vomiting. 30 tablet 1   No current facility-administered medications on file prior to visit.    Review of Systems Pertinent items noted in HPI and remainder of comprehensive ROS otherwise negative.  Indications for ASA therapy (per UpToDate) One of the following: (Needs 162 mg daily) Previous pregnancy with preeclampsia, especially early onset and with an adverse outcome No Chronic hypertension No GHTN in previous pregnancy Type 1 or 2 diabetes mellitus No Multifetal gestation No Chronic kidney disease No Autoimmune disease (antiphospholipid syndrome, systemic lupus erythematosus) No Two or more of the following: (Can do 81 mg daily) Nulliparity No Obesity (body mass index >30 kg/m2) Yes Family history of preeclampsia in mother or sister No Age >=35 years No Sociodemographic characteristics (African American race, low socioeconomic level) No Personal risk factors (eg, previous pregnancy with low birth weight or small for gestational age infant,  previous adverse pregnancy outcome [eg, stillbirth], interval >10 years between pregnancies) No In vitro conception No  Physical Exam:   Vitals:   11/09/24 1017  BP: 116/68  Pulse: 84  Weight: 259 lb (117.5 kg)    Bedside Ultrasound for FHR check: Viable intrauterine pregnancy with positive cardiac activity noted  General: well-developed, well-nourished female in no acute distress  Skin: normal coloration and turgor, no rashes  Neurologic: oriented, normal, negative, normal mood  Extremities: normal strength, tone, and muscle mass, ROM of all joints is normal  HEENT PERRLA, extraocular movement intact and sclera clear, anicteric  Neck supple and no masses  Cardiovascular: regular  rate and rhythm  Respiratory:  no respiratory distress, normal breath sounds  Abdomen: soft, non-tender; bowel sounds normal; no masses,  no organomegaly  Pelvic: deferred   Assessment:  Pregnancy: G3P1011 Patient Active Problem List   Diagnosis Date Noted   Supervision of other normal pregnancy, antepartum 10/11/2024   Essential hypertension 10/11/2024   Cervical radiculopathy 08/17/2023   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 Encounter for supervision of low-risk pregnancy in second trimester - Patient doing well.  - Cervicovaginal ancillary only( [place]) - CBC/D/Plt+RPR+Rh+ABO+RubIgG... - HgB A1c - Comp Met (CMET) - PANORAMA PRENATAL TEST  2. [redacted] weeks gestation of pregnancy (Primary) - Reviewed fetal movement expectations for 2nd trimester.  - Cervicovaginal ancillary only( [place]) - CBC/D/Plt+RPR+Rh+ABO+RubIgG... - HgB A1c - Comp Met (CMET)  3. History of gestational hypertension - Not previously on meds. Resolved in Postpartum   4. Painful urination - Patient reports delayed voiding and minimal water intake.  - Reviewed UTI symptoms and recommend testing today. Patient agreeable to plan of care.   Initial labs drawn. Continue prenatal vitamins. Problem list reviewed and updated. Genetic Screening discussed, Panorama and Horizon: requested. Ultrasound discussed; fetal anatomic survey: planned. Anticipatory guidance about prenatal visits given including labs, ultrasounds, and testing. Weight gain recommendations per IOM guidelines reviewed: underweight/BMI 18.5 or less > 28 - 40 lbs; normal weight/BMI 18.5 - 24.9 > 25 - 35 lbs;  overweight/BMI 25 - 29.9 > 15 - 25 lbs; obese/BMI 30 or more > 11 - 20 lbs. Discussed usage of the Babyscripts app for more information about pregnancy, and to track blood pressures. Also discussed usage of virtual visits as additional source of managing and completing prenatal visits.  Patient was encouraged to use MyChart to review results, send requests, and have questions addressed.   The nature of Center for [facility] with multiple MDs and Advanced Practice Providers was explained to patient; also emphasized that residents, students are part of our team. Routine obstetric precautions reviewed. Encouraged to seek out care at our office or emergency room [facility] preferred) for urgent and/or emergent concerns. Return in about 4 weeks (around 12/07/2024) for LOB.     Vickye Astorino Erven) Emilio, MSN, CNM  Center for [facility]  11/09/2024 6:51 PM

## 2024-11-10 LAB — CBC/D/PLT+RPR+RH+ABO+RUBIGG...
Antibody Screen: NEGATIVE
Basophils Absolute: 0 x10E3/uL (ref 0.0–0.2)
Basos: 0 %
EOS (ABSOLUTE): 0.1 x10E3/uL (ref 0.0–0.4)
Eos: 1 %
HCV Ab: NONREACTIVE
HIV Screen 4th Generation wRfx: NONREACTIVE
Hematocrit: 40.4 % (ref 34.0–46.6)
Hemoglobin: 13.6 g/dL (ref 11.1–15.9)
Hepatitis B Surface Ag: NEGATIVE
Immature Grans (Abs): 0 x10E3/uL (ref 0.0–0.1)
Immature Granulocytes: 0 %
Lymphocytes Absolute: 2.1 x10E3/uL (ref 0.7–3.1)
Lymphs: 31 %
MCH: 31.2 pg (ref 26.6–33.0)
MCHC: 33.7 g/dL (ref 31.5–35.7)
MCV: 93 fL (ref 79–97)
Monocytes Absolute: 0.4 x10E3/uL (ref 0.1–0.9)
Monocytes: 6 %
Neutrophils Absolute: 4.2 x10E3/uL (ref 1.4–7.0)
Neutrophils: 62 %
Platelets: 240 x10E3/uL (ref 150–450)
RBC: 4.36 x10E6/uL (ref 3.77–5.28)
RDW: 12.3 % (ref 11.7–15.4)
RPR Ser Ql: NONREACTIVE
Rh Factor: POSITIVE
Rubella Antibodies, IGG: <0.9 {index} — ABNORMAL LOW (ref >0.99)
WBC: 6.8 x10E3/uL (ref 3.4–10.8)

## 2024-11-10 LAB — COMPREHENSIVE METABOLIC PANEL WITH GFR
ALT: 7 IU/L (ref 0–32)
AST: 11 IU/L (ref 0–40)
Albumin: 3.9 g/dL — ABNORMAL LOW (ref 4.0–5.0)
Alkaline Phosphatase: 56 IU/L (ref 41–116)
BUN/Creatinine Ratio: 14 (ref 9–23)
BUN: 6 mg/dL (ref 6–20)
Bilirubin Total: 0.4 mg/dL (ref 0.0–1.2)
CO2: 16 mmol/L — ABNORMAL LOW (ref 20–29)
Calcium: 9.1 mg/dL (ref 8.7–10.2)
Chloride: 104 mmol/L (ref 96–106)
Creatinine, Ser: 0.42 mg/dL — ABNORMAL LOW (ref 0.57–1.00)
Globulin, Total: 2.6 g/dL (ref 1.5–4.5)
Glucose: 77 mg/dL (ref 70–99)
Potassium: 4.1 mmol/L (ref 3.5–5.2)
Sodium: 137 mmol/L (ref 134–144)
Total Protein: 6.5 g/dL (ref 6.0–8.5)
eGFR: 138 mL/min/1.73 (ref >59)

## 2024-11-10 LAB — HEMOGLOBIN A1C
Est. average glucose Bld gHb Est-mCnc: 82 mg/dL
Hgb A1c MFr Bld: 4.5 % — ABNORMAL LOW (ref 4.8–5.6)

## 2024-11-10 LAB — HCV INTERPRETATION

## 2024-11-13 LAB — CERVICOVAGINAL ANCILLARY ONLY
Chlamydia: NEGATIVE
Comment: NEGATIVE
Comment: NEGATIVE
Comment: NORMAL
Neisseria Gonorrhea: NEGATIVE
Trichomonas: NEGATIVE

## 2024-11-17 LAB — PANORAMA PRENATAL TEST FULL PANEL:PANORAMA TEST PLUS 5 ADDITIONAL MICRODELETIONS: FETAL FRACTION: 8.6

## 2024-11-21 ENCOUNTER — Encounter: Payer: Self-pay | Admitting: Certified Nurse Midwife

## 2024-12-02 ENCOUNTER — Encounter: Payer: Self-pay | Admitting: Certified Nurse Midwife

## 2024-12-07 ENCOUNTER — Ambulatory Visit (INDEPENDENT_AMBULATORY_CARE_PROVIDER_SITE_OTHER): Admitting: Physician Assistant

## 2024-12-07 ENCOUNTER — Encounter: Payer: Self-pay | Admitting: Physician Assistant

## 2024-12-07 VITALS — BP 101/64 | HR 72 | Wt 263.2 lb

## 2024-12-07 DIAGNOSIS — Z3A16 16 weeks gestation of pregnancy: Secondary | ICD-10-CM | POA: Diagnosis not present

## 2024-12-07 DIAGNOSIS — Z9884 Bariatric surgery status: Secondary | ICD-10-CM

## 2024-12-07 DIAGNOSIS — Z8759 Personal history of other complications of pregnancy, childbirth and the puerperium: Secondary | ICD-10-CM

## 2024-12-07 DIAGNOSIS — Z6838 Body mass index (BMI) 38.0-38.9, adult: Secondary | ICD-10-CM | POA: Diagnosis not present

## 2024-12-07 DIAGNOSIS — Z3480 Encounter for supervision of other normal pregnancy, unspecified trimester: Secondary | ICD-10-CM

## 2024-12-07 DIAGNOSIS — I10 Essential (primary) hypertension: Secondary | ICD-10-CM

## 2024-12-07 DIAGNOSIS — Z3482 Encounter for supervision of other normal pregnancy, second trimester: Secondary | ICD-10-CM | POA: Diagnosis not present

## 2024-12-07 NOTE — Progress Notes (Signed)
 "  PRENATAL VISIT NOTE  Subjective:  Kelly Lam is a 28 y.o. G3P1011 at 100w5d being seen today for ongoing prenatal care.  She is currently monitored for the following issues for this high-risk pregnancy and has Morbid obesity (HCC); Genetic carrier; Supervision of other normal pregnancy, antepartum; Anxiety; Attention deficit hyperactivity disorder (ADHD); Cervical radiculopathy; Essential hypertension; Insulin resistance; Inversion of nipple; Myopia of both eyes; S/P laparoscopic sleeve gastrectomy; Snoring; and Suspected sleep apnea on their problem list.  Patient reports no complaints.  Contractions: Not present. Vag. Bleeding: None.  Movement: Present. Denies leaking of fluid.   The following portions of the patient's history were reviewed and updated as appropriate: allergies, current medications, past family history, past medical history, past social history, past surgical history and problem list.   Objective:   Vitals:   12/07/24 1133  BP: 101/64  Pulse: 72  Weight: 263 lb 3.2 oz (119.4 kg)    Fetal Status:  Fetal Heart Rate (bpm): 153   Movement: Present    General: Alert, oriented and cooperative. Patient is in no acute distress.  Skin: Skin is warm and dry. No rash noted.   Cardiovascular: Normal heart rate noted  Respiratory: Normal respiratory effort, no problems with respiration noted  Abdomen: Soft, gravid, appropriate for gestational age.  Pain/Pressure: Present     Pelvic: Cervical exam deferred        Extremities: Normal range of motion.  Edema: None  Mental Status: Normal mood and affect. Normal behavior. Normal judgment and thought content.      11/09/2024    4:40 PM 10/11/2024   10:42 AM  Depression screen PHQ 2/9  Decreased Interest 1 0  Down, Depressed, Hopeless 0 0  PHQ - 2 Score 1 0  Altered sleeping 0 0  Tired, decreased energy 1 0  Change in appetite 1 0  Feeling bad or failure about yourself  0 0  Trouble concentrating 0 0  Moving slowly or  fidgety/restless 0 0  Suicidal thoughts 0 0  PHQ-9 Score 3 0      Data saved with a previous flowsheet row definition        11/09/2024    4:40 PM 10/11/2024   10:43 AM  GAD 7 : Generalized Anxiety Score  Nervous, Anxious, on Edge 1 1  Control/stop worrying 1 1  Worry too much - different things 0 1  Trouble relaxing 0 0  Restless 0 1  Easily annoyed or irritable 0 0  Afraid - awful might happen 1 0  Total GAD 7 Score 3 4    Assessment and Plan:  Pregnancy: G3P1011 at [redacted]w[redacted]d  1. Supervision of other normal pregnancy, antepartum (Primary) Patient doing well BP, FHR, FH appropriate   2. [redacted] weeks gestation of pregnancy Anticipatory guidance about next visits/weeks of pregnancy given.  Anatomy scan 01/02/24  3. History of gestational hypertension Normotensive Continue ASA  4. S/P laparoscopic sleeve gastrectomy No anemia on initial CBC. Will likely need vitamin B12, folate, vitamin D, calcium. Pt to defer today.   5. BMI 38.0-38.9,adult Continue ASA  Preterm labor symptoms and general obstetric precautions including but not limited to vaginal bleeding, contractions, leaking of fluid and fetal movement were reviewed in detail with the patient.  Please refer to After Visit Summary for other counseling recommendations.   No follow-ups on file.  Future Appointments  Date Time Provider Department Center  01/01/2025 10:00 AM [facility] PROVIDER 1 [facility]  01/01/2025 10:30 AM [facility] US1 [facility]  01/05/2025 10:55 AM Delores Nidia CROME, FNP [facility] None    Jorene FORBES Moats, PA-C  "

## 2024-12-07 NOTE — Progress Notes (Signed)
 ROB: No concerns today.

## 2024-12-15 ENCOUNTER — Encounter: Payer: Self-pay | Admitting: Certified Nurse Midwife

## 2024-12-19 ENCOUNTER — Encounter: Payer: Self-pay | Admitting: Certified Nurse Midwife

## 2024-12-25 ENCOUNTER — Inpatient Hospital Stay (HOSPITAL_COMMUNITY)
Admission: AD | Admit: 2024-12-25 | Discharge: 2024-12-25 | Disposition: A | Discharge status: Home / Self Care | Attending: Obstetrics & Gynecology | Admitting: Obstetrics & Gynecology

## 2024-12-25 DIAGNOSIS — O98812 Other maternal infectious and parasitic diseases complicating pregnancy, second trimester: Secondary | ICD-10-CM | POA: Diagnosis not present

## 2024-12-25 DIAGNOSIS — Z113 Encounter for screening for infections with a predominantly sexual mode of transmission: Secondary | ICD-10-CM | POA: Diagnosis present

## 2024-12-25 DIAGNOSIS — Z3480 Encounter for supervision of other normal pregnancy, unspecified trimester: Secondary | ICD-10-CM

## 2024-12-25 DIAGNOSIS — Z3A19 19 weeks gestation of pregnancy: Secondary | ICD-10-CM | POA: Insufficient documentation

## 2024-12-25 DIAGNOSIS — O26892 Other specified pregnancy related conditions, second trimester: Secondary | ICD-10-CM | POA: Diagnosis not present

## 2024-12-25 DIAGNOSIS — B3731 Acute candidiasis of vulva and vagina: Secondary | ICD-10-CM | POA: Diagnosis not present

## 2024-12-25 DIAGNOSIS — Z0371 Encounter for suspected problem with amniotic cavity and membrane ruled out: Secondary | ICD-10-CM | POA: Diagnosis present

## 2024-12-25 LAB — POCT FERN TEST: POCT Fern Test: NEGATIVE

## 2024-12-25 LAB — WET PREP, GENITAL
Clue Cells Wet Prep HPF POC: NONE SEEN
Sperm: NONE SEEN
Trich, Wet Prep: NONE SEEN
WBC, Wet Prep HPF POC: <10
Yeast Wet Prep HPF POC: NONE SEEN

## 2024-12-25 LAB — URINALYSIS, ROUTINE W REFLEX MICROSCOPIC
Bilirubin Urine: NEGATIVE
Glucose, UA: NEGATIVE mg/dL
Hgb urine dipstick: NEGATIVE
Ketones, ur: NEGATIVE mg/dL
Nitrite: NEGATIVE
Protein, ur: NEGATIVE mg/dL
Specific Gravity, Urine: 1.019 (ref 1.005–1.030)
pH: 5 (ref 5.0–8.0)

## 2024-12-25 MED ORDER — TERCONAZOLE 0.8 % VA CREA: 1.0000 | TOPICAL_CREAM | Freq: Every day | VAGINAL | 0 refills | Status: AC

## 2024-12-25 NOTE — MAU Note (Addendum)
 Pt says at 5pm- was standing - and large gush of fluid come out of her vagina . Also at 6pm- had a trickle of fluid come out- clear- to yellow.  At 5pm- when she wiped - red spots on toilet paper . On underwear in Triage- nickle size wet - no blood. Cramping started at 7pm- lower abd - 6/10. No meds for cramps .  Now cramps are 6/10. PNC- [facility]  Last sex- 2 mths ago

## 2024-12-25 NOTE — MAU Provider Note (Signed)
 " History     [id number]  Arrival date and time: 12/25/24 1833    Chief Complaint  Patient presents with   Rupture of Membranes   Vaginal Bleeding     HPI Kelly Lam is a 29 y.o. at [redacted]w[redacted]d who presents for vaginal discharge and leakage of fluid. Fluid was yellow in color. Also complains of abdominal cramping but no spotting. Did not take any medications at home for the pain. No cramping currently.    A/Positive/-- (11/20 1116)  Past Medical History:  Diagnosis Date   Gestational hypertension 12/01/2018   Morbid obesity with BMI of 50.0-59.9, adult (HCC)    Pregnancy induced hypertension    Urinary tract infection     Past Surgical History:  Procedure Laterality Date   BARIATRIC SURGERY  10/13/2023   bariatric sleeve   HERNIA REPAIR     TONSILLECTOMY     WISDOM TOOTH EXTRACTION      Family History  Problem Relation Age of Onset   Hypertension Father    Diabetes Maternal Grandfather    Congestive Heart Failure Maternal Grandfather    Cancer Maternal Grandfather    Huntington's disease Paternal Grandfather     Social History   Socioeconomic History   Marital status: Single    Spouse name: Not on file   Number of children: Not on file   Years of education: Not on file   Highest education level: Not on file  Occupational History   Not on file  Tobacco Use   Smoking status: Never   Smokeless tobacco: Never  Vaping Use   Vaping status: Never Used  Substance and Sexual Activity   Alcohol use: No   Drug use: Never   Sexual activity: Not Currently    Partners: Male  Other Topics Concern   Not on file  Social History Narrative   Not on file   Social Drivers of Health   Tobacco Use: Low Risk (11/09/2024)   Patient History    Smoking Tobacco Use: Never    Smokeless Tobacco Use: Never    Passive Exposure: Not on file  Financial Resource Strain: Not on file  Food Insecurity: Low Risk (07/18/2024)   Received from [facility]   Epic    Within the past  12 months, you worried that your food would run out before you got money to buy more: Never true    Within the past 12 months, the food you bought just didn't last and you didn't have money to get more. : Never true  Transportation Needs: No Transportation Needs (07/18/2024)   Received from [facility]    In the past 12 months, has lack of reliable transportation kept you from medical appointments, meetings, work or from getting things needed for daily living? : No  Physical Activity: Not on file  Stress: Not on file  Social Connections: Not on file  Intimate Partner Violence: Not on file  Depression (PHQ2-9): Low Risk (11/09/2024)   Depression (PHQ2-9)    PHQ-2 Score: 3  Alcohol Screen: Not on file  Housing: Low Risk (07/18/2024)   Received from [facility]   Epic    What is your living situation today?: I have a steady place to live    Think about the place you live. Do you have problems with any of the following? Choose all that apply:: None/None on this list  Utilities: Low Risk (07/18/2024)   Received from [facility]   Utilities  In the past 12 months has the electric, gas, oil, or water company threatened to shut off services in your home? : No  Health Literacy: Not on file    Allergies[1]  Medications Ordered Prior to Encounter[2]  Pertinent positives and negative per HPI, all others reviewed and negative  Physical Exam   BP (!) 110/53 (BP Location: Right Arm)   Pulse 74   Temp 98.3 F (36.8 C) (Oral)   Resp 12   Ht 5' 8 (1.727 m)   Wt 121.2 kg   LMP 08/12/2024 (Exact Date)   BMI 40.64 kg/m   Patient Vitals for the past 24 hrs:  BP Temp Temp src Pulse Resp Height Weight  12/25/24 1955 (!) 110/53 98.3 F (36.8 C) Oral 74 12 5' 8 (1.727 m) 121.2 kg    Physical Exam Vitals and nursing note reviewed. Exam conducted with a chaperone present.  Constitutional:      Appearance: She is well-developed.  HENT:     Head: Normocephalic and  atraumatic.     Mouth/Throat:     Mouth: Mucous membranes are moist.  Eyes:     Extraocular Movements: Extraocular movements intact.  Cardiovascular:     Rate and Rhythm: Normal rate and regular rhythm.  Pulmonary:     Effort: Pulmonary effort is normal.  Abdominal:     Palpations: Abdomen is soft.     Tenderness: There is no abdominal tenderness.  Genitourinary:    Vagina: Vaginal discharge present.     Cervix: Discharge present.     Comments: Fulminant white cottage cheese discharge seen in vagina.  Skin:    Capillary Refill: Capillary refill takes less than 2 seconds.  Neurological:     General: No focal deficit present.     Mental Status: She is alert.     Labs Results for orders placed or performed during the hospital encounter of 12/25/24 (from the past 24 hours)  Urinalysis, Routine w reflex microscopic -Urine, Clean Catch     Status: Abnormal   Collection Time: 12/25/24  8:05 PM  Result Value Ref Range   Color, Urine YELLOW YELLOW   APPearance HAZY (A) CLEAR   Specific Gravity, Urine 1.019 1.005 - 1.030   pH 5.0 5.0 - 8.0   Glucose, UA NEGATIVE NEGATIVE mg/dL   Hgb urine dipstick NEGATIVE NEGATIVE   Bilirubin Urine NEGATIVE NEGATIVE   Ketones, ur NEGATIVE NEGATIVE mg/dL   Protein, ur NEGATIVE NEGATIVE mg/dL   Nitrite NEGATIVE NEGATIVE   Leukocytes,Ua LARGE (A) NEGATIVE   RBC / HPF 0-5 0 - 5 RBC/hpf   WBC, UA 0-5 0 - 5 WBC/hpf   Bacteria, UA RARE (A) NONE SEEN   Squamous Epithelial / HPF 0-5 0 - 5 /HPF   Mucus PRESENT   Wet prep, genital     Status: None   Collection Time: 12/25/24 10:06 PM   Specimen: PATH Cytology Cervicovaginal Ancillary Only  Result Value Ref Range   Yeast Wet Prep HPF POC NONE SEEN NONE SEEN   Trich, Wet Prep NONE SEEN NONE SEEN   Clue Cells Wet Prep HPF POC NONE SEEN NONE SEEN   WBC, Wet Prep HPF POC <10 <10   Sperm NONE SEEN   Fern Test     Status: None   Collection Time: 12/25/24 10:08 PM  Result Value Ref Range   POCT Fern Test  Negative = intact amniotic membranes     Imaging No results found.  MAU Course  Procedures  Lab Orders  Wet prep, genital         Urinalysis, Routine w reflex microscopic -Urine, Clean Catch         Fern Test    Meds ordered this encounter  Medications   terconazole  (TERAZOL 3 ) 0.8 % vaginal cream    Sig: Place 1 applicator vaginally at bedtime.    Dispense:  20 g    Refill:  0   Imaging Orders  No imaging studies ordered today    MDM Moderate (Level 3-4)  Assessment and Plan  Supervision of other normal pregnancy, antepartum  Vaginal candidiasis  [redacted] weeks gestation of pregnancy   Kelly Lam is a 28 y.o. at [redacted]w[redacted]d who presents for vaginal discharge and leakage of fluid.   -Wet prep negative but SSE findings consistent with candidiasis.  -U/A not consistent with infectious process.  -Fern negative -Bedside US  confirms adequate amniotic fluid volume -GC/Ch pending -Cervix closed/thick/posterior on SVE.   -Stable for discharge home -Discussed PTL S/S -Rx sent for 3 day course of terconazle -Patient has follow up OB appointment next week, encouraged to keep appointment. -All questions answered, anticipatory guidance and detailed return precautions provided.      Kelly Weedman L Meridian Scherger, MD/MHA 12/25/2024 10:43 PM  Allergies as of 12/25/2024   No Known Allergies      Medication List     TAKE these medications    acetaminophen  325 MG tablet Commonly known as: TYLENOL  Take 650 mg by mouth every 6 (six) hours as needed for mild pain (pain score 1-3) or moderate pain (pain score 4-6).   Doxylamine -Pyridoxine  10-10 MG Tbec Commonly known as: Diclegis  Take 2 tablets by mouth at bedtime. If symptoms persist, add one tablet in the morning and one in the afternoon   escitalopram 20 MG tablet Commonly known as: LEXAPRO Take 20 mg by mouth daily.   multivitamin-prenatal 27-0.8 MG Tabs tablet Take 1 tablet by mouth daily at 12 noon.   promethazine  25 MG  tablet Commonly known as: PHENERGAN  Take 1 tablet (25 mg total) by mouth every 6 (six) hours as needed for nausea or vomiting.   terconazole  0.8 % vaginal cream Commonly known as: TERAZOL 3  Place 1 applicator vaginally at bedtime.           [1] No Known Allergies [2]  No current facility-administered medications on file prior to encounter.   Current Outpatient Medications on File Prior to Encounter  Medication Sig Dispense Refill   Doxylamine -Pyridoxine  (DICLEGIS ) 10-10 MG TBEC Take 2 tablets by mouth at bedtime. If symptoms persist, add one tablet in the morning and one in the afternoon 100 tablet 5   escitalopram (LEXAPRO) 20 MG tablet Take 20 mg by mouth daily.     Prenatal Vit-Fe Fumarate-FA (MULTIVITAMIN-PRENATAL) 27-0.8 MG TABS tablet Take 1 tablet by mouth daily at 12 noon.     promethazine  (PHENERGAN ) 25 MG tablet Take 1 tablet (25 mg total) by mouth every 6 (six) hours as needed for nausea or vomiting. 30 tablet 1   acetaminophen  (TYLENOL ) 325 MG tablet Take 650 mg by mouth every 6 (six) hours as needed for mild pain (pain score 1-3) or moderate pain (pain score 4-6).     "

## 2024-12-26 LAB — GC/CHLAMYDIA PROBE AMP (~~LOC~~) NOT AT ARMC
Chlamydia: NEGATIVE
Comment: NEGATIVE
Comment: NORMAL
Neisseria Gonorrhea: NEGATIVE

## 2025-01-01 ENCOUNTER — Ambulatory Visit (HOSPITAL_BASED_OUTPATIENT_CLINIC_OR_DEPARTMENT_OTHER): Admitting: Obstetrics

## 2025-01-01 ENCOUNTER — Ambulatory Visit (HOSPITAL_BASED_OUTPATIENT_CLINIC_OR_DEPARTMENT_OTHER)

## 2025-01-01 ENCOUNTER — Other Ambulatory Visit: Payer: Self-pay | Admitting: *Deleted

## 2025-01-01 ENCOUNTER — Ambulatory Visit: Attending: Obstetrics and Gynecology

## 2025-01-01 VITALS — BP 106/49 | HR 86

## 2025-01-01 DIAGNOSIS — O99212 Obesity complicating pregnancy, second trimester: Secondary | ICD-10-CM

## 2025-01-01 DIAGNOSIS — O285 Abnormal chromosomal and genetic finding on antenatal screening of mother: Secondary | ICD-10-CM | POA: Insufficient documentation

## 2025-01-01 DIAGNOSIS — I10 Essential (primary) hypertension: Secondary | ICD-10-CM | POA: Insufficient documentation

## 2025-01-01 DIAGNOSIS — E669 Obesity, unspecified: Secondary | ICD-10-CM

## 2025-01-01 DIAGNOSIS — O09292 Supervision of pregnancy with other poor reproductive or obstetric history, second trimester: Secondary | ICD-10-CM | POA: Diagnosis not present

## 2025-01-01 DIAGNOSIS — Z3A20 20 weeks gestation of pregnancy: Secondary | ICD-10-CM | POA: Insufficient documentation

## 2025-01-01 DIAGNOSIS — Z3480 Encounter for supervision of other normal pregnancy, unspecified trimester: Secondary | ICD-10-CM | POA: Insufficient documentation

## 2025-01-01 DIAGNOSIS — O99210 Obesity complicating pregnancy, unspecified trimester: Secondary | ICD-10-CM

## 2025-01-01 DIAGNOSIS — O280 Abnormal hematological finding on antenatal screening of mother: Secondary | ICD-10-CM | POA: Insufficient documentation

## 2025-01-01 DIAGNOSIS — Z363 Encounter for antenatal screening for malformations: Secondary | ICD-10-CM | POA: Diagnosis not present

## 2025-01-01 DIAGNOSIS — O99842 Bariatric surgery status complicating pregnancy, second trimester: Secondary | ICD-10-CM | POA: Insufficient documentation

## 2025-01-01 DIAGNOSIS — Z362 Encounter for other antenatal screening follow-up: Secondary | ICD-10-CM

## 2025-01-01 DIAGNOSIS — Z148 Genetic carrier of other disease: Secondary | ICD-10-CM | POA: Insufficient documentation

## 2025-01-01 NOTE — Progress Notes (Signed)
 "    [facility] at [facility] [address] Phone:  [phone]   Fax:  [phone]      In-Person Genetic Counseling Clinic Note:   I spoke with 29 y.o. Kelly Lam today to discuss her carrier screening results. She was referred by Kelly Lynwood MATSU, MD. She was accompanied by Kelly Lam.   Pregnancy History:    G3P1011. EGA: [redacted]w[redacted]d by LMP. EDD: 05/19/2025. Kelly Lam has a healthy son. She had an early SAB of unknown etiology. Denies major personal health concerns. Denies bleeding, infections, and fevers in this pregnancy. Denies using tobacco, alcohol, or street drugs in this pregnancy.   Family History:    A three-generation pedigree was created and scanned into Epic under the Media tab.  Patient reports her grandmother had two stillbirths, unknown causes. This can be due to several factors including genetic causes. Without additional information, recurrence risk is difficult to estimate.  Patient ethnicity reported as White and Kelly ethnicity reported as White. Denies Ashkenazi Jewish ancestry.  Family history not remarkable for consanguinity, individuals with birth defects, intellectual disability, autism spectrum disorder, multiple spontaneous abortions, still births, or unexplained neonatal death.  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 Carriers are not expected to have symptoms. Of note, Kelly Lam was not found to be a carrier for cystic fibrosis which significantly reduces but does not eliminate the chance of being a carrier. Her hemoglobin fractionation cascade was within normal limits. No beta-thalassemia or hemoglobin variants were detected; of note, this does not screen for alpha thalassemia. Please see report for residual risk information.    We reviewed the natural history of SMA, the SMN1 genes, and the autosomal recessive mode of inheritance of the condition. We reviewed that Kelly Lam would either pass down the chromosome with the functional SMN1 gene OR the chromosome with the deletion. If Kelly Lam 's reproductive partner is found to also be a carrier for SMA, there would be a 25% risk for the current pregnancy and all future pregnancies the couple has together to be affected (have zero functional copies of the SMN1 gene).   Given Kelly Lam's carrier screening results, we discussed and offered carrier screening for her reproductive partner. We reviewed the benefits and limitations of carrier screening and that it can detect most but not all carriers. We discussed that if both members of a couple are carriers for the same condition, prenatal diagnostic testing is avaliable to determine if the pregnancy is affected. We briefly reviewed the benefits and limitations of diagnostic testing including the 1 in 500 increased risk for miscarriage from the procedure. If diagnostic testing via amniocentesis is not desired, SMA testing can be completed after birth and is included in Kelly Lam 's Newborn Screening Program. The couple scheduled a lab visit for Kelly carrier screening during the next [facility] visit.  In the meantime, we calculated the chance that this couple's pregnancy would be affected with SMA based on Kelly Lam's screening results and Kelly's ethnicity. This would be a 1 in 140 (~0.7%) chance that this couple's pregnancy would be affected. Carrier screening for Kelly will allow us  to provide more accurate risk.  Newborn Screening. The Kelly Lam  Newborn Screening (NBS) program will screen all newborn babies for cystic  fibrosis, spinal muscular atrophy, hemoglobinopathies, and numerous other conditions.  Previous Testing Completed:  Low risk NIPS: Kelly Lam previously completed [facility] noninvasive prenatal screening (NIPS) in this pregnancy. The  result is low risk, consistent with a female fetus. This screening significantly reduces but does not eliminate the chance that the current pregnancy has Down syndrome (trisomy 2), trisomy 98, trisomy 79, common sex chromosome conditions, and 22q11.2 microdeletion syndrome. Please see report for residual risk information. There are many genetic conditions that cannot be detected by NIPS.    Plan of Care:   Kelly plans to have SMA carrier screening during next [facility] appointment on 01/30/2025. Newborn screening. Follow-up [facility] ultrasound on 01/30/2025.   Informed consent was obtained. All questions were answered.   45 minutes were spent on the date of the encounter in service to the patient including preparation, face-to-face consultation, discussion of test reports and available next steps, pedigree construction, genetic risk assessment, documentation, and care coordination.    Thank you for sharing in the care of Kelly Lam with us .  Please do not hesitate to contact us  at [phone] if you have any questions.   Kelly Bodily, MS, [facility] Certified Genetic Counselor   Genetic counseling student involved in appointment: No. "

## 2025-01-01 NOTE — Progress Notes (Signed)
 MFM Consult Note  Kelly Lam is currently at [redacted]w[redacted]d. She was seen today for a detailed fetal anatomy scan due to maternal obesity with a BMI of 39.4.    She had bariatric surgery performed 2-1/2 years ago and has lost about 150 pounds since the surgery.  She denies any problems in her current pregnancy.  Her blood pressure today was 106/49.  She had a cell free DNA test earlier in her pregnancy which indicated a low risk for trisomy 24, 66, and 13. A female fetus is predicted.   She screened positive as a carrier for spinal muscular atrophy during her prior pregnancy.  Sonographic findings Single intrauterine pregnancy at 20w 2d. Fetal cardiac activity:  Observed and appears normal. Presentation: Breech. The views of the fetal anatomy were limited today due to maternal body habitus.  What was visualized today appeared within normal limits. Fetal biometry shows the estimated fetal weight of 0 lb 13 oz,  378 grams (74%). Amniotic fluid: Within normal limits.  MVP: 6.39 cm. Placenta: Posterior. Adnexa: No abnormality visualized. Cervical length: 4 cm.  The patient was informed that anomalies may be missed due to technical limitations. If the fetus is in a suboptimal position or maternal habitus is increased, visualization of the fetus in the maternal uterus may be impaired.  Obesity in pregnancy and history of bariatric surgery  We will continue to follow her with growth ultrasounds throughout her pregnancy.    Weekly fetal testing should be started at 34 weeks and continued until delivery.    Delivery should be considered at around 39 weeks.  Due to her history of gestational hypertension in her prior pregnancy, she should continue taking a daily baby aspirin for preeclampsia prophylaxis.  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  She should have her husband screened to determine if he is  also a carrier for this condition.  They were reassured that their child would be at low risk for inheriting this disorder should her husband screen negative as a carrier for this condition.    The patient met with our genetic counselor today.    Her husband will have his blood drawn at her next visit to screen to determine if he is also a carrier for spinal muscular atrophy.  A follow-up exam was scheduled in 4 weeks to assess the fetal growth and to complete the views of the fetal anatomy.  The patient stated that all of her questions were answered.   A total of 45 minutes was spent counseling and coordinating the care for this patient.  Greater than 50% of the time was spent in direct face-to-face contact.

## 2025-01-05 ENCOUNTER — Encounter: Payer: Self-pay | Admitting: Obstetrics and Gynecology

## 2025-01-05 ENCOUNTER — Ambulatory Visit: Payer: Self-pay | Admitting: Obstetrics and Gynecology

## 2025-01-05 VITALS — BP 97/60 | HR 73 | Wt 269.8 lb

## 2025-01-05 DIAGNOSIS — Z148 Genetic carrier of other disease: Secondary | ICD-10-CM | POA: Diagnosis not present

## 2025-01-05 DIAGNOSIS — Z9884 Bariatric surgery status: Secondary | ICD-10-CM

## 2025-01-05 DIAGNOSIS — Z3A20 20 weeks gestation of pregnancy: Secondary | ICD-10-CM

## 2025-01-05 DIAGNOSIS — Z3480 Encounter for supervision of other normal pregnancy, unspecified trimester: Secondary | ICD-10-CM

## 2025-01-05 DIAGNOSIS — Z3482 Encounter for supervision of other normal pregnancy, second trimester: Secondary | ICD-10-CM | POA: Diagnosis not present

## 2025-01-05 NOTE — Patient Instructions (Signed)
   Considering Waterbirth? Guide for patients at Center for [facility]) Why consider waterbirth? Gentle birth for babies  Less pain medicine used in labor  May allow for passive descent/less pushing  May reduce perineal tears  More mobility and instinctive maternal position changes  Increased maternal relaxation   Is waterbirth safe? What are the risks of infection, drowning or other complications? Infection:  Very low risk (3.7 % for tub vs 4.8% for bed)  7 in 8000 waterbirths with documented infection  Poorly cleaned equipment most common cause  Slightly lower group B strep transmission rate  Drowning  Maternal:  Very low risk  Related to seizures or fainting  Newborn:  Very low risk. No evidence of increased risk of respiratory problems in multiple large studies  Physiological protection from breathing under water  Avoid underwater birth if there are any fetal complications  Once baby's head is out of the water, keep it out.  Birth complication  Some reports of cord trauma, but risk decreased by bringing baby to surface gradually  No evidence of increased risk of shoulder dystocia. Mothers can usually change positions faster in water than in a bed, possibly aiding the maneuvers to free the shoulder.   There are 2 things you MUST do to have a waterbirth with [facility]: Attend a waterbirth class at [facility] at [facility]   3rd Wednesday of every month from 7-9 pm (virtual during COVID) [employer] at [email or web address] or [email or web address] or by calling [phone] Bring us  the certificate from the class to your prenatal appointment or send via MyChart Meet with a midwife at 36 weeks* to see if you can still plan a waterbirth and to sign the consent.   *We also recommend that you schedule as many of your prenatal visits with a midwife as possible.    Helpful information: You may want to bring a bathing suit top to the hospital  to wear during labor but this is optional.  All other supplies are provided by the hospital. Please arrive at the hospital with signs of active labor, and do not wait at home until late in labor. It takes 45 min- 1 hour for fetal monitoring, and check in to your room to take place, plus transport and filling of the waterbirth tub.    Things that would prevent you from having a waterbirth: Premature, <37wks  Previous cesarean birth  Presence of thick meconium-stained fluid  Multiple gestation (Twins, triplets, etc.)  Uncontrolled diabetes or gestational diabetes requiring medication  Hypertension diagnosed in pregnancy or preexisting hypertension (gestational hypertension, preeclampsia, or chronic hypertension) Fetal growth restriction (your baby measures less than 10th percentile on ultrasound) Heavy vaginal bleeding  Non-reassuring fetal heart rate  Active infection (MRSA, etc.). Group B Strep is NOT a contraindication for waterbirth.  If your labor has to be induced and induction method requires continuous monitoring of the baby's heart rate  Other risks/issues identified by your obstetrical provider   Please remember that birth is unpredictable. Under certain unforeseeable circumstances your provider may advise against giving birth in the tub. These decisions will be made on a case-by-case basis and with the safety of you and your baby as our highest priority.    Updated 03/25/22

## 2025-01-05 NOTE — Progress Notes (Signed)
 Pt presents for ROB visit. Declined AFP. Interested in [facility], need class info

## 2025-01-05 NOTE — Progress Notes (Signed)
" ° °  PRENATAL VISIT NOTE  Subjective:  Kelly Lam is a 29 y.o. G3P1011 at [redacted]w[redacted]d being seen today for ongoing prenatal care.  She is currently monitored for the following issues for this low-risk pregnancy and has Morbid obesity (HCC); Carrier of spinal muscular atrophy; Supervision of other normal pregnancy, antepartum; Anxiety; Attention deficit hyperactivity disorder (ADHD); Cervical radiculopathy; Insulin resistance; Inversion of nipple; Myopia of both eyes; S/P laparoscopic sleeve gastrectomy; Snoring; and Suspected sleep apnea on their problem list.  Patient reports no complaints.  Contractions: Not present. Vag. Bleeding: None.  Movement: Present. Denies leaking of fluid.   The following portions of the patient's history were reviewed and updated as appropriate: allergies, current medications, past family history, past medical history, past social history, past surgical history and problem list.   Objective:   Vitals:   01/05/25 1117  BP: 97/60  Pulse: 73  Weight: 269 lb 12.8 oz (122.4 kg)    Fetal Status:  Fetal Heart Rate (bpm): 140   Movement: Present    General: Alert, oriented and cooperative. Patient is in no acute distress.  Skin: Skin is warm and dry. No rash noted.   Cardiovascular: Normal heart rate noted  Respiratory: Normal respiratory effort, no problems with respiration noted  Abdomen: Soft, gravid, appropriate for gestational age.  Pain/Pressure: Present (cramping)     Pelvic: Cervical exam deferred        Extremities: Normal range of motion.  Edema: None  Mental Status: Normal mood and affect. Normal behavior. Normal judgment and thought content.   Assessment and Plan:  Pregnancy: G3P1011 at [redacted]w[redacted]d 1. Supervision of other normal pregnancy, antepartum (Primary) BP and FHR normal Doing well, feeling regular movement   2. S/P laparoscopic sleeve gastrectomy  3. [redacted] weeks gestation of pregnancy Interested in waterbirth, schedule with midwife, class information  given   4. Carrier of spinal muscular atrophy Met with genetic counselor, info provided today for partner testing   Preterm labor symptoms and general obstetric precautions including but not limited to vaginal bleeding, contractions, leaking of fluid and fetal movement were reviewed in detail with the patient. Please refer to After Visit Summary for other counseling recommendations.   Return in about 4 weeks (around 02/02/2025), or schedule with midwife, for OB VISIT (MD or APP).  Future Appointments  Date Time Provider Department Center  01/30/2025 10:55 AM Regino Camie LABOR, CNM [facility] None  01/30/2025  1:15 PM [facility] PROVIDER 1 [facility]  01/30/2025  1:30 PM [facility] US4 [facility]    Nidia Daring, FNP  "

## 2025-01-30 ENCOUNTER — Other Ambulatory Visit

## 2025-01-30 ENCOUNTER — Ambulatory Visit

## 2025-01-30 ENCOUNTER — Encounter: Payer: Self-pay | Admitting: Certified Nurse Midwife
